# Patient Record
Sex: Female | Born: 1975 | Race: Black or African American | Hispanic: No | Marital: Single | Smoking: Never smoker
Health system: Southern US, Community
[De-identification: ages and names within clinical notes are randomized; demographics above are authoritative.]

## PROBLEM LIST (undated history)

## (undated) DIAGNOSIS — D649 Anemia, unspecified: Secondary | ICD-10-CM

## (undated) DIAGNOSIS — I10 Essential (primary) hypertension: Secondary | ICD-10-CM

## (undated) DIAGNOSIS — E042 Nontoxic multinodular goiter: Secondary | ICD-10-CM

## (undated) HISTORY — PX: NO PAST SURGERIES: SHX2092

---

## 2010-11-24 ENCOUNTER — Other Ambulatory Visit (HOSPITAL_COMMUNITY)
Admission: RE | Admit: 2010-11-24 | Discharge: 2010-11-24 | Disposition: A | Discharge status: Home / Self Care | Payer: BC Managed Care – PPO | Source: Ambulatory Visit | Attending: Family Medicine | Admitting: Family Medicine

## 2010-11-24 DIAGNOSIS — Z1159 Encounter for screening for other viral diseases: Secondary | ICD-10-CM | POA: Insufficient documentation

## 2010-11-24 DIAGNOSIS — Z124 Encounter for screening for malignant neoplasm of cervix: Secondary | ICD-10-CM | POA: Insufficient documentation

## 2011-08-21 ENCOUNTER — Ambulatory Visit (INDEPENDENT_AMBULATORY_CARE_PROVIDER_SITE_OTHER): Payer: BC Managed Care – PPO | Admitting: Family Medicine

## 2011-08-21 ENCOUNTER — Ambulatory Visit: Payer: BC Managed Care – PPO

## 2011-08-21 VITALS — BP 138/88 | HR 92 | Temp 98.4°F | Resp 17 | Ht 62.0 in | Wt 198.0 lb

## 2011-08-21 DIAGNOSIS — R1032 Left lower quadrant pain: Secondary | ICD-10-CM

## 2011-08-21 DIAGNOSIS — K5900 Constipation, unspecified: Secondary | ICD-10-CM

## 2011-08-21 LAB — POCT CBC
Granulocyte percent: 58.5 % (ref 37–80)
HCT, POC: 41.8 % (ref 37.7–47.9)
Hemoglobin: 12.9 g/dL (ref 12.2–16.2)
Lymph, poc: 2.5 (ref 0.6–3.4)
MCH, POC: 27 pg (ref 27–31.2)
MCHC: 30.9 g/dL — AB (ref 31.8–35.4)
MCV: 87.4 fL (ref 80–97)
MID (cbc): 0.8 (ref 0–0.9)
MPV: 8.8 fL (ref 0–99.8)
POC Granulocyte: 4.6 (ref 2–6.9)
POC LYMPH PERCENT: 31.1 % (ref 10–50)
POC MID %: 10.4 % (ref 0–12)
Platelet Count, POC: 555 K/uL — AB (ref 142–424)
RBC: 4.78 M/uL (ref 4.04–5.48)
RDW, POC: 15.3 %
WBC: 7.9 K/uL (ref 4.6–10.2)

## 2011-08-21 LAB — POCT URINALYSIS DIPSTICK
Bilirubin, UA: NEGATIVE
Blood, UA: NEGATIVE
Glucose, UA: NEGATIVE
Ketones, UA: NEGATIVE
Leukocytes, UA: NEGATIVE
Nitrite, UA: NEGATIVE
Spec Grav, UA: >=1.03
Urobilinogen, UA: 0.2
pH, UA: 5.5

## 2011-08-21 LAB — POCT UA - MICROSCOPIC ONLY
Casts, Ur, LPF, POC: NEGATIVE
Crystals, Ur, HPF, POC: NEGATIVE
Yeast, UA: NEGATIVE

## 2011-08-21 NOTE — Progress Notes (Signed)
[facility]:  Office Visit  Chief Complaint:  Chief Complaint  Patient presents with  . Abdominal Pain    llq    HPI: Cheryl Foley is a 36 y.o. female who complains of  1 week h/o of aching, gasey, intermittent localized LLQ abd pain. Tried MIralax and milk of magnesia with minimal relief. Associated with green colored , loose stools. Denies nausea/vomiting. Able to pass gas. After BM felt better. + nausea. LMP 07/31/2011. Not in relationship. Denies prior abd surgeries. PO intake nl, no new meds, foods, travels.  Food helps her abd pain. Denies h/o PUD,GERD,  gallstones, ovarian cysts. Denies f hx of IBD, crohns, IBS. Last BM yesterday. Denis dysuria, flank pain, vaginal dc  History reviewed. No pertinent past medical history. History reviewed. No pertinent past surgical history. History   Social History  . Marital Status: Unknown    Spouse Name: N/A    Number of Children: N/A  . Years of Education: N/A   Social History Main Topics  . Smoking status: Never Smoker   . Smokeless tobacco: None  . Alcohol Use: None  . Drug Use: None  . Sexually Active: None   Other Topics Concern  . None   Social History Narrative  . None   Family History  Problem Relation Age of Onset  . Hypertension Mother    No Known Allergies Prior to Admission medications   Not on File     ROS: The patient denies fevers, chills, night sweats, unintentional weight loss, chest pain, palpitations, wheezing, dyspnea on exertion, dysuria, hematuria, melena, numbness, weakness, or tingling.   All other systems have been reviewed and were otherwise negative with the exception of those mentioned in the HPI and as above.    PHYSICAL EXAM: Filed Vitals:   08/21/11 1848  BP: 138/88  Pulse: 92  Temp: 98.4 F (36.9 C)  Resp: 17   Filed Vitals:   08/21/11 1848  Height: 5\' 2"  (1.575 m)  Weight: 198 lb (89.812 kg)   Body mass index is 36.21 kg/(m^2).  General: Alert, no  acute distress, obese AA Female HEENT:  Normocephalic, atraumatic, oropharynx patent.  Cardiovascular:  Regular rate and rhythm, no rubs murmurs or gallops.  No Carotid bruits, radial pulse intact. No pedal edema.  Respiratory: Clear to auscultation bilaterally.  No wheezes, rales, or rhonchi.  No cyanosis, no use of accessory musculature GI: No organomegaly, abdomen is soft and non-tender, positive bowel sounds.  No masses. Skin: No rashes. Neurologic: Facial musculature symmetric. Psychiatric: Patient is appropriate throughout our interaction. Lymphatic: No cervical lymphadenopathy Musculoskeletal: Gait intact.   LABS: Results for orders placed in visit on 08/21/11  POCT CBC      Component Value Range   WBC 7.9  4.6 - 10.2 K/uL   Lymph, poc 2.5  0.6 - 3.4   POC LYMPH PERCENT 31.1  10 - 50 %L   MID (cbc) 0.8  0 - 0.9   POC MID % 10.4  0 - 12 %M   POC Granulocyte 4.6  2 - 6.9   Granulocyte percent 58.5  37 - 80 %G   RBC 4.78  4.04 - 5.48 M/uL   Hemoglobin 12.9  12.2 - 16.2 g/dL   HCT, POC 62.9  52.8 - 47.9 %   MCV 87.4  80 - 97 fL   MCH, POC 27.0  27 - 31.2 pg   MCHC 30.9 (*) 31.8 - 35.4 g/dL   RDW, POC 15.3  Platelet Count, POC 555 (*) 142 - 424 K/uL   MPV 8.8  0 - 99.8 fL  POCT UA - MICROSCOPIC ONLY      Component Value Range   WBC, Ur, HPF, POC 1-4     RBC, urine, microscopic 3-6     Bacteria, U Microscopic 3+     Mucus, UA moderate     Epithelial cells, urine per micros 4-8     Crystals, Ur, HPF, POC neg     Casts, Ur, LPF, POC neg     Yeast, UA neg    POCT URINALYSIS DIPSTICK      Component Value Range   Color, UA dark yellow     Clarity, UA cloudy     Glucose, UA neg     Bilirubin, UA neg     Ketones, UA neg 15mg /dL     Spec Grav, UA >=4.540     Blood, UA neg     pH, UA 5.5     Protein, UA trace     Urobilinogen, UA 0.2     Nitrite, UA neg     Leukocytes, UA Negative       EKG/XRAY:   Primary read interpreted by Dr. Conley Rolls at [facility]. Nonspecific gas  pattern, no obstruction, no free gas/air + stool   ASSESSMENT/PLAN: Encounter Diagnoses  Name Primary?  . Abdominal pain, acute, left lower quadrant Yes  . Constipation    Continue with miralax scheduled Add Sennokot S, colace or dulcolax until have consistent soft stools Increase fluids, increase fiber Monitor for s/sx of SBO, worsening N/V/abd pain, fevers, chills. Patient still has her appendix. At this time I do not think this is appendicitis or referred pain.  If continues to have abd pain then return for f/u    Robyn Galati PHUONG, DO 08/22/2011 11:14 AM

## 2011-08-23 ENCOUNTER — Telehealth: Payer: Self-pay

## 2011-08-23 NOTE — Telephone Encounter (Signed)
Pt states that she is still experiencing abdominal pain, pt did as directed and took a laxative but pain on her left side has not yet subsided.  Best# [phone]

## 2011-08-23 NOTE — Telephone Encounter (Signed)
I think pt needs to f/u er Dr Irwin Brakeman not.

## 2011-08-24 NOTE — Telephone Encounter (Signed)
Pt reports that she took some Dulcolax the day after OV and it allowed her to have a BM and seemed to completely clear out her system. Her pain did improve after that and she has been taking Miralax since then but has not had another BM. She has started having some abd pain again, esp after drinking some milk last night, but pain not as bad as before. Still no fever, no N/V. Encouraged pt to cont w/Miralax and inc water, fruits/vegs and exercise and to avoid cheese/dairy.Advised pt If she conts to have pain and/or no BMs to RTC for recheck tomorrow or before if worsens, or ED if after hours. Pt agreed.   After speaking w/Sarah called pt back and LM that she can also take Miralax BID until she has normal (soft) BM and then return to QD.

## 2012-01-16 DIAGNOSIS — E042 Nontoxic multinodular goiter: Secondary | ICD-10-CM

## 2012-01-16 HISTORY — DX: Nontoxic multinodular goiter: E04.2

## 2012-02-15 ENCOUNTER — Other Ambulatory Visit: Payer: Self-pay | Admitting: Family Medicine

## 2012-02-15 DIAGNOSIS — E042 Nontoxic multinodular goiter: Secondary | ICD-10-CM

## 2012-02-22 ENCOUNTER — Ambulatory Visit
Admission: RE | Admit: 2012-02-22 | Discharge: 2012-02-22 | Disposition: A | Discharge status: Home / Self Care | Payer: BC Managed Care – PPO | Source: Ambulatory Visit | Attending: Family Medicine | Admitting: Family Medicine

## 2012-02-22 DIAGNOSIS — E042 Nontoxic multinodular goiter: Secondary | ICD-10-CM

## 2012-02-26 ENCOUNTER — Other Ambulatory Visit: Payer: Self-pay | Admitting: Lactation Services

## 2012-02-26 DIAGNOSIS — E041 Nontoxic single thyroid nodule: Secondary | ICD-10-CM

## 2012-03-13 ENCOUNTER — Ambulatory Visit
Admission: RE | Admit: 2012-03-13 | Discharge: 2012-03-13 | Disposition: A | Discharge status: Home / Self Care | Payer: BC Managed Care – PPO | Source: Ambulatory Visit | Attending: Family Medicine | Admitting: Family Medicine

## 2012-03-13 ENCOUNTER — Other Ambulatory Visit (HOSPITAL_COMMUNITY)
Admission: RE | Admit: 2012-03-13 | Discharge: 2012-03-13 | Disposition: A | Discharge status: Home / Self Care | Payer: BC Managed Care – PPO | Source: Ambulatory Visit | Attending: Interventional Radiology | Admitting: Interventional Radiology

## 2012-03-13 DIAGNOSIS — E049 Nontoxic goiter, unspecified: Secondary | ICD-10-CM | POA: Insufficient documentation

## 2012-10-02 ENCOUNTER — Other Ambulatory Visit: Payer: Self-pay | Admitting: Family Medicine

## 2012-10-02 DIAGNOSIS — R102 Pelvic and perineal pain: Secondary | ICD-10-CM

## 2012-10-03 ENCOUNTER — Ambulatory Visit
Admission: RE | Admit: 2012-10-03 | Discharge: 2012-10-03 | Disposition: A | Discharge status: Home / Self Care | Payer: BC Managed Care – PPO | Source: Ambulatory Visit | Attending: Family Medicine | Admitting: Family Medicine

## 2012-10-03 DIAGNOSIS — R102 Pelvic and perineal pain: Secondary | ICD-10-CM

## 2012-10-29 ENCOUNTER — Other Ambulatory Visit: Payer: Self-pay | Admitting: Family Medicine

## 2012-10-29 DIAGNOSIS — N83209 Unspecified ovarian cyst, unspecified side: Secondary | ICD-10-CM

## 2012-11-21 ENCOUNTER — Ambulatory Visit
Admission: RE | Admit: 2012-11-21 | Discharge: 2012-11-21 | Disposition: A | Discharge status: Home / Self Care | Payer: BC Managed Care – PPO | Source: Ambulatory Visit | Attending: Family Medicine | Admitting: Family Medicine

## 2012-11-21 DIAGNOSIS — N83209 Unspecified ovarian cyst, unspecified side: Secondary | ICD-10-CM

## 2012-11-25 ENCOUNTER — Other Ambulatory Visit (HOSPITAL_COMMUNITY): Payer: Self-pay | Admitting: Family Medicine

## 2012-11-25 DIAGNOSIS — N83209 Unspecified ovarian cyst, unspecified side: Secondary | ICD-10-CM

## 2012-11-27 ENCOUNTER — Ambulatory Visit (HOSPITAL_COMMUNITY): Admission: RE | Admit: 2012-11-27 | Payer: BC Managed Care – PPO | Source: Ambulatory Visit

## 2013-01-12 ENCOUNTER — Other Ambulatory Visit: Payer: Self-pay | Admitting: Internal Medicine

## 2013-01-12 DIAGNOSIS — E042 Nontoxic multinodular goiter: Secondary | ICD-10-CM

## 2013-01-16 ENCOUNTER — Other Ambulatory Visit: Payer: BC Managed Care – PPO

## 2013-02-06 ENCOUNTER — Ambulatory Visit
Admission: RE | Admit: 2013-02-06 | Discharge: 2013-02-06 | Disposition: A | Discharge status: Home / Self Care | Payer: BC Managed Care – PPO | Source: Ambulatory Visit | Attending: Internal Medicine | Admitting: Internal Medicine

## 2013-02-06 DIAGNOSIS — E042 Nontoxic multinodular goiter: Secondary | ICD-10-CM

## 2013-03-06 ENCOUNTER — Encounter (HOSPITAL_COMMUNITY): Payer: Self-pay | Admitting: Pharmacist

## 2013-03-09 NOTE — H&P (Signed)
Cheryl Foley is an 38 y.o. female G0 who presents for surgical management of her left ovarian cyst.  She initially noted LLQ pain in September, an ultrasound was performed revealing a L ovarian cyst measuring 5cmx 4.6cm x 4cm suspected to be a hemorrhagic cyst. The patient was management with conservative treatment as her pain remained low to moderate and the cyst remained stable in size.  Moreover, CA 125 was low and the patient strongly desired not to have surgery.  Recently, follow up ultrasound showed an increase in cyst size and upon more questioning, the patient's pain has persisted.    Pertinent Gynecological History: Menses: menses regular q 31 days Contraception: abstinence, never sexually active DES exposure: denies Blood transfusions: none Sexually transmitted diseases: no past history Previous GYN Procedures: none  Last pap: normal Date: November 2012 OB History: G0, P0    Medical history:  GERD, Thyroid nodules, Seasonal allergies, Hyperlipidemia (borderline), Overweight Surgical history: none   Family History  Problem Relation Age of Onset  . Hypertension Mother     Social History:  reports that she has never smoked. She does not have any smokeless tobacco history on file. Her alcohol and drug histories are not on file.  Allergies: No Known Allergies  Medications: Multivitamin  Review of Systems  Constitutional: Negative for fever and chills.  Eyes: Negative for blurred vision and double vision.  Respiratory: Negative for cough and shortness of breath.   Cardiovascular: Negative for chest pain, palpitations and leg swelling.  Gastrointestinal: Negative for heartburn, nausea and vomiting.  Genitourinary: Negative for dysuria and urgency.  Musculoskeletal: Negative for myalgias.  Skin: Negative.   Neurological: Negative for dizziness, seizures and headaches.  Psychiatric/Behavioral: The patient is nervous/anxious.    Physical Exam  Constitutional: She is  oriented to person, place, and time. She appears well-developed and well-nourished.  HENT:  Head: Normocephalic.  Eyes: EOM are normal.  Neck: Normal range of motion. No thyromegaly present.  Cardiovascular: Normal rate and regular rhythm.   Respiratory: Breath sounds normal.  GI: She exhibits no distension. There is no rebound and no guarding.  Genitourinary:  labia - unremarkable, vagina - pink moist mucosa, no lesions, currently on menses, cervix - no discharge or lesions or CMT, adnexa - no masses appreciated, no adnexal tenderness bilaterally, uterus - nontender and normal size on palpation.    Musculoskeletal: She exhibits edema.  Neurological: She is oriented to person, place, and time.  Skin: Skin is warm and dry.    Labs:  September 2014: L ovarian cyst measuring 5cmx 4.6cm x 4cm suspected to be a hemorrhagic cyst. Repeat U/S 11/21/12: stable left ovarian cyst measuring the exact same size Seen by me on 11/27/12: CA 125:33 Limited study due to transabdominal only- Complex cyst with septation 6.6x5.3x4.8cm, increased in size from previous US, avascular. Uterus and right ovary not visualized.  Assessment/Plan: 38yo G0 who presents for laparoscopic ovarian cystectomy due to left ovarian cyst. -Risk, benefit, indications and alternative reviewed with patient include risk of bleeding, infection and injury including the need for potential oophorectomy.  Questions and concerns answered in the office. -NPO -LR @ 125cc/hr -SCDs to OR   Janyth Pupa, M 03/09/2013, 6:30 PM

## 2013-03-10 ENCOUNTER — Encounter (HOSPITAL_COMMUNITY): Payer: Self-pay | Admitting: *Deleted

## 2013-03-12 ENCOUNTER — Encounter (HOSPITAL_COMMUNITY)
Admission: RE | Disposition: A | Discharge status: Home / Self Care | Payer: Self-pay | Source: Ambulatory Visit | Attending: Obstetrics & Gynecology

## 2013-03-12 ENCOUNTER — Ambulatory Visit (HOSPITAL_COMMUNITY)
Admission: RE | Admit: 2013-03-12 | Discharge: 2013-03-12 | Disposition: A | Discharge status: Home / Self Care | Payer: BC Managed Care – PPO | Source: Ambulatory Visit | Attending: Obstetrics & Gynecology | Admitting: Obstetrics & Gynecology

## 2013-03-12 ENCOUNTER — Encounter (HOSPITAL_COMMUNITY): Payer: Self-pay | Admitting: Anesthesiology

## 2013-03-12 ENCOUNTER — Ambulatory Visit (HOSPITAL_COMMUNITY): Payer: BC Managed Care – PPO | Admitting: Anesthesiology

## 2013-03-12 ENCOUNTER — Encounter (HOSPITAL_COMMUNITY): Payer: BC Managed Care – PPO | Admitting: Anesthesiology

## 2013-03-12 DIAGNOSIS — K219 Gastro-esophageal reflux disease without esophagitis: Secondary | ICD-10-CM | POA: Insufficient documentation

## 2013-03-12 DIAGNOSIS — R1032 Left lower quadrant pain: Secondary | ICD-10-CM | POA: Insufficient documentation

## 2013-03-12 DIAGNOSIS — N83209 Unspecified ovarian cyst, unspecified side: Secondary | ICD-10-CM | POA: Insufficient documentation

## 2013-03-12 DIAGNOSIS — N801 Endometriosis of ovary: Secondary | ICD-10-CM | POA: Insufficient documentation

## 2013-03-12 DIAGNOSIS — N80109 Endometriosis of ovary, unspecified side, unspecified depth: Secondary | ICD-10-CM | POA: Insufficient documentation

## 2013-03-12 HISTORY — PX: LAPAROSCOPIC LYSIS OF ADHESIONS: SHX5905

## 2013-03-12 HISTORY — DX: Anemia, unspecified: D64.9

## 2013-03-12 HISTORY — DX: Nontoxic multinodular goiter: E04.2

## 2013-03-12 HISTORY — PX: LAPAROSCOPIC OVARIAN CYSTECTOMY: SHX6248

## 2013-03-12 LAB — PREGNANCY, URINE: PREG TEST UR: NEGATIVE

## 2013-03-12 LAB — CBC
HCT: 38.1 % (ref 36.0–46.0)
HEMOGLOBIN: 13 g/dL (ref 12.0–15.0)
MCH: 28.4 pg (ref 26.0–34.0)
MCHC: 34.1 g/dL (ref 30.0–36.0)
MCV: 83.2 fL (ref 78.0–100.0)
Platelets: 490 10*3/uL — ABNORMAL HIGH (ref 150–400)
RBC: 4.58 MIL/uL (ref 3.87–5.11)
RDW: 14.1 % (ref 11.5–15.5)
WBC: 10.6 10*3/uL — ABNORMAL HIGH (ref 4.0–10.5)

## 2013-03-12 SURGERY — EXCISION, CYST, OVARY, LAPAROSCOPIC
Anesthesia: General | Site: Abdomen

## 2013-03-12 MED ORDER — SODIUM CHLORIDE 0.9 % IJ SOLN
INTRAMUSCULAR | Status: DC | PRN
  Administered 2013-03-12: 3 mL

## 2013-03-12 MED ORDER — ONDANSETRON HCL 4 MG/2ML IJ SOLN: 4.0000 mg | Freq: Once | INTRAMUSCULAR | Status: DC | PRN

## 2013-03-12 MED ORDER — ONDANSETRON HCL 4 MG/2ML IJ SOLN
INTRAMUSCULAR | Status: AC
  Filled 2013-03-12: qty 2

## 2013-03-12 MED ORDER — MIDAZOLAM HCL 2 MG/2ML IJ SOLN
INTRAMUSCULAR | Status: AC
  Filled 2013-03-12: qty 2

## 2013-03-12 MED ORDER — FENTANYL CITRATE 0.05 MG/ML IJ SOLN
25.0000 ug | INTRAMUSCULAR | Status: DC | PRN
  Administered 2013-03-12: 25 ug via INTRAVENOUS
  Administered 2013-03-12: 50 ug via INTRAVENOUS
  Administered 2013-03-12 (×2): 25 ug via INTRAVENOUS

## 2013-03-12 MED ORDER — FENTANYL CITRATE 0.05 MG/ML IJ SOLN
INTRAMUSCULAR | Status: AC
  Filled 2013-03-12: qty 5

## 2013-03-12 MED ORDER — SODIUM CHLORIDE 0.9 % IJ SOLN
INTRAMUSCULAR | Status: AC
  Filled 2013-03-12: qty 10

## 2013-03-12 MED ORDER — ONDANSETRON HCL 4 MG/2ML IJ SOLN
INTRAMUSCULAR | Status: DC | PRN
  Administered 2013-03-12: 4 mg via INTRAVENOUS

## 2013-03-12 MED ORDER — FENTANYL CITRATE 0.05 MG/ML IJ SOLN
INTRAMUSCULAR | Status: DC | PRN
  Administered 2013-03-12 (×7): 50 ug via INTRAVENOUS

## 2013-03-12 MED ORDER — LIDOCAINE 1%/NA BICARB 0.1 MEQ INJECTION
INJECTION | INTRAVENOUS | Status: AC
  Filled 2013-03-12: qty 1

## 2013-03-12 MED ORDER — LIDOCAINE HCL (CARDIAC) 20 MG/ML IV SOLN
INTRAVENOUS | Status: DC | PRN
  Administered 2013-03-12: 70 mg via INTRAVENOUS
  Administered 2013-03-12: 30 mg via INTRAVENOUS

## 2013-03-12 MED ORDER — FENTANYL CITRATE 0.05 MG/ML IJ SOLN
INTRAMUSCULAR | Status: AC
  Filled 2013-03-12: qty 2

## 2013-03-12 MED ORDER — KETOROLAC TROMETHAMINE 30 MG/ML IJ SOLN
30.0000 mg | Freq: Once | INTRAMUSCULAR | Status: AC
  Administered 2013-03-12: 30 mg via INTRAVENOUS

## 2013-03-12 MED ORDER — ROCURONIUM BROMIDE 100 MG/10ML IV SOLN
INTRAVENOUS | Status: DC | PRN
  Administered 2013-03-12: 5 mg via INTRAVENOUS
  Administered 2013-03-12: 10 mg via INTRAVENOUS
  Administered 2013-03-12: 5 mg via INTRAVENOUS
  Administered 2013-03-12: 30 mg via INTRAVENOUS

## 2013-03-12 MED ORDER — PROPOFOL 10 MG/ML IV BOLUS
INTRAVENOUS | Status: DC | PRN
  Administered 2013-03-12: 200 mg via INTRAVENOUS

## 2013-03-12 MED ORDER — OXYCODONE HCL 5 MG/5ML PO SOLN: 5.0000 mg | Freq: Once | ORAL | Status: DC | PRN

## 2013-03-12 MED ORDER — GLYCOPYRROLATE 0.2 MG/ML IJ SOLN
INTRAMUSCULAR | Status: AC
  Filled 2013-03-12: qty 3

## 2013-03-12 MED ORDER — PROPOFOL 10 MG/ML IV EMUL
INTRAVENOUS | Status: AC
  Filled 2013-03-12: qty 20

## 2013-03-12 MED ORDER — ACETAMINOPHEN 325 MG PO TABS: 325.0000 mg | ORAL_TABLET | ORAL | Status: DC | PRN

## 2013-03-12 MED ORDER — KETOROLAC TROMETHAMINE 30 MG/ML IJ SOLN: 15.0000 mg | Freq: Once | INTRAMUSCULAR | Status: DC | PRN

## 2013-03-12 MED ORDER — NEOSTIGMINE METHYLSULFATE 1 MG/ML IJ SOLN
INTRAMUSCULAR | Status: AC
  Filled 2013-03-12: qty 1

## 2013-03-12 MED ORDER — NEOSTIGMINE METHYLSULFATE 1 MG/ML IJ SOLN
INTRAMUSCULAR | Status: DC | PRN
  Administered 2013-03-12: 3 mg via INTRAVENOUS

## 2013-03-12 MED ORDER — LACTATED RINGERS IV SOLN
INTRAVENOUS | Status: DC
  Administered 2013-03-12 (×2): via INTRAVENOUS

## 2013-03-12 MED ORDER — BUPIVACAINE HCL (PF) 0.25 % IJ SOLN
INTRAMUSCULAR | Status: AC
  Filled 2013-03-12: qty 30

## 2013-03-12 MED ORDER — KETOROLAC TROMETHAMINE 30 MG/ML IJ SOLN
INTRAMUSCULAR | Status: AC
  Filled 2013-03-12: qty 1

## 2013-03-12 MED ORDER — LIDOCAINE HCL (CARDIAC) 20 MG/ML IV SOLN
INTRAVENOUS | Status: AC
  Filled 2013-03-12: qty 5

## 2013-03-12 MED ORDER — ROCURONIUM BROMIDE 100 MG/10ML IV SOLN
INTRAVENOUS | Status: AC
  Filled 2013-03-12: qty 1

## 2013-03-12 MED ORDER — DEXAMETHASONE SODIUM PHOSPHATE 10 MG/ML IJ SOLN
INTRAMUSCULAR | Status: DC | PRN
  Administered 2013-03-12: 10 mg via INTRAVENOUS

## 2013-03-12 MED ORDER — DEXAMETHASONE SODIUM PHOSPHATE 10 MG/ML IJ SOLN
INTRAMUSCULAR | Status: AC
  Filled 2013-03-12: qty 1

## 2013-03-12 MED ORDER — OXYCODONE HCL 5 MG PO TABS: 5.0000 mg | ORAL_TABLET | Freq: Once | ORAL | Status: DC | PRN

## 2013-03-12 MED ORDER — GLYCOPYRROLATE 0.2 MG/ML IJ SOLN
INTRAMUSCULAR | Status: DC | PRN
  Administered 2013-03-12: 0.4 mg via INTRAVENOUS

## 2013-03-12 MED ORDER — MIDAZOLAM HCL 2 MG/2ML IJ SOLN
INTRAMUSCULAR | Status: DC | PRN
  Administered 2013-03-12: 2 mg via INTRAVENOUS

## 2013-03-12 MED ORDER — BUPIVACAINE HCL 0.25 % IJ SOLN
INTRAMUSCULAR | Status: DC | PRN
  Administered 2013-03-12: 20 mL

## 2013-03-12 MED ORDER — LACTATED RINGERS IV SOLN
INTRAVENOUS | Status: DC
  Administered 2013-03-12 (×2): via INTRAVENOUS

## 2013-03-12 MED ORDER — ACETAMINOPHEN 160 MG/5ML PO SOLN: 325.0000 mg | ORAL | Status: DC | PRN

## 2013-03-12 SURGICAL SUPPLY — 34 items
BARRIER ADHS 3X4 INTERCEED (GAUZE/BANDAGES/DRESSINGS) ×4 IMPLANT
CABLE HIGH FREQUENCY MONO STRZ (ELECTRODE) ×4 IMPLANT
CATH ROBINSON RED A/P 16FR (CATHETERS) ×4 IMPLANT
CHLORAPREP W/TINT 26ML (MISCELLANEOUS) ×4 IMPLANT
DECANTER SPIKE VIAL GLASS SM (MISCELLANEOUS) ×4 IMPLANT
DERMABOND ADVANCED (GAUZE/BANDAGES/DRESSINGS) ×2
DERMABOND ADVANCED .7 DNX12 (GAUZE/BANDAGES/DRESSINGS) ×2 IMPLANT
ELECT REM PT RETURN 9FT ADLT (ELECTROSURGICAL) ×4
ELECTRODE REM PT RTRN 9FT ADLT (ELECTROSURGICAL) ×2 IMPLANT
GLOVE BIO SURGEON STRL SZ 6.5 (GLOVE) ×6 IMPLANT
GLOVE BIO SURGEON STRL SZ7 (GLOVE) ×4 IMPLANT
GLOVE BIO SURGEONS STRL SZ 6.5 (GLOVE) ×2
GLOVE BIOGEL PI IND STRL 6.5 (GLOVE) ×4 IMPLANT
GLOVE BIOGEL PI IND STRL 7.0 (GLOVE) ×2 IMPLANT
GLOVE BIOGEL PI INDICATOR 6.5 (GLOVE) ×4
GLOVE BIOGEL PI INDICATOR 7.0 (GLOVE) ×2
GLOVE ECLIPSE 7.0 STRL STRAW (GLOVE) ×4 IMPLANT
GLOVE INDICATOR 7.0 STRL GRN (GLOVE) ×4 IMPLANT
GLOVE SURG SS PI 7.0 STRL IVOR (GLOVE) ×16 IMPLANT
GOWN STRL REUS W/TWL LRG LVL3 (GOWN DISPOSABLE) ×12 IMPLANT
NEEDLE INSUFFLATION 120MM (ENDOMECHANICALS) ×4 IMPLANT
PACK LAPAROSCOPY I 1258 (SET/KITS/TRAYS/PACK) ×4 IMPLANT
PAD OB MATERNITY 4.3X12.25 (PERSONAL CARE ITEMS) ×4 IMPLANT
PROTECTOR NERVE ULNAR (MISCELLANEOUS) ×8 IMPLANT
SCALPEL HARMONIC ACE (MISCELLANEOUS) ×4 IMPLANT
SET IRRIG TUBING LAPAROSCOPIC (IRRIGATION / IRRIGATOR) ×4 IMPLANT
SPONGE GAUZE 4X4 12PLY (GAUZE/BANDAGES/DRESSINGS) ×4 IMPLANT
SUT VIC AB 4-0 PS2 18 (SUTURE) ×4 IMPLANT
SUT VICRYL 0 UR6 27IN ABS (SUTURE) ×12 IMPLANT
SYR 30ML LL (SYRINGE) ×4 IMPLANT
TOWEL OR 17X26 10 PK STRL BLUE (TOWEL DISPOSABLE) ×8 IMPLANT
TROCAR Z-THREAD BLADED 11X100M (TROCAR) ×4 IMPLANT
TROCAR Z-THREAD BLADED 5X100MM (TROCAR) ×4 IMPLANT
WARMER LAPAROSCOPE (MISCELLANEOUS) ×4 IMPLANT

## 2013-03-12 NOTE — Anesthesia Postprocedure Evaluation (Signed)
  Anesthesia Post-op Note  Patient: Cheryl Foley  Procedure(s) Performed: Procedure(s): LAPAROSCOPIC drainage of OVARIAN CYSTECTOMY (Left) LAPAROSCOPIC LYSIS OF ADHESIONS (N/A) Patient is awake and responsive. Pain and nausea are reasonably well controlled. Vital signs are stable and clinically acceptable. Oxygen saturation is clinically acceptable. There are no apparent anesthetic complications at this time. Patient is ready for discharge.

## 2013-03-12 NOTE — Op Note (Signed)
PREOPERATIVE DIAGNOSIS:  Left ovarian cyst, pelvic pain POSTOPERATIVE DIAGNOSIS:  1) Left ovarian endometrioma 2) Endometriosis 3) Dense pelvic adhesions PROCEDURE PERFORMED: Operative laparoscopy with drainage of left ovarian cyst, Lysis of adhesions  SURGEON: Dr. Janyth Pupa ASSISTANT:Dr. Cyndia Skeeters ANESTHESIA: General endotracheal.  ESTIMATED BLOOD LOSS: Minimal.  URINE OUTPUT: 20cc of clear yellow urine prior to the start of the procedure.  SPECIMEN(S): 1) left ovarian cyst wall 2) peritoneal fluid COMPLICATIONS: None.  CONDITION: Stable.  FINDINGS: No ascites or peritoneal studding was appreciated.  Filmy adhesions noted between the side wall and bowel in the RUQ.  Normal appearing uterus and bilateral fallopian tubes.   Right ovary with dense adhesions to the lateral side wall.  Left ovary enlarged with a 5x6x5cm ovarian cyst with adhesions noted to the sigmoid colo and uterus.  Informed consent was obtained from the patient prior to taking her to the operating room where anesthesia was found to be adequate. She was placed in dorsal lithotomy position and examined under anesthesia. She was prepped and draped in normal sterile fashion. The bladder was catheterized with a foley under sterile technique.  A bi-valve speculum was then placed and the anterior lip of the cervix was grasped with the single tooth tenaculum. The [facility] uterine manipulator was then advanced into the uterus to provide uterine mobility. The speculum and tenaculum were then removed.  Attention was then turned to the patients abdomen where a 10 mm infraumbilical skin incision was made with the scalpel. The veress needle was carefully introduced into the peritoneal cavity while tenting the abdominal wall. Intraperitoneal placement was confirmed by use of a saline-drop test.  The gas was connected and confirmed intrabdominal placement by a low initial pressure of 4 mmHg. The abdomen was then insuflated with CO2 gas.   Intraabdominal placement was confirmed by the laparoscope and surveillance of the abdomen was performed.  In the right lower quadrant- Marcaine was injected, a 92mm skin incision was made with the scalpel.  The trocar and sleeve were then advanced without difficulty into the abdomen under direct visualization. In the left lower quadrant, in a similar fashion, local was injected and the trocar with sleeve were advanced under direct visualization without difficulty.  Peritoneal washings was obtained.  Examination of the abdomen was performed with the findings as listed above.  Once the right ovary was visualized and appeared grossly normal, attention was turned to the left ovary.  The adhesions to the bowel were taken down bluntly.  However, due to the dense nature of the adhesions, the harmonic scalpel was also used.  During manipulation, the cyst was incidentally ruptured with drainage of chocolate brown fluid consistent with an endometrioma.  With drainage, the demarcation of the bowel and ovary was better visualized and complete separation was obtained.  Once the cyst was completely drained, removal of the cyst wall was attempted; however, the wall was densely adherent to the ovarian tissue.  A small sample of the cyst wall was obtained.  No bleeding was seen from the ovary or the bowel.    Attention was then turned to the right upper quadrant where adhesions to the side wall were taken down sharply using monopolar scissors.  The bowel was completely freed from the side wall, hemostasis noted.  Re-examination of the abdominal cavity was performed- hemostasis was noted.  Interceed was placed over and within the left ovary.  The instruments were then removed from the patients abdomen with air allowed to fully escape.  The fascia  was closed using 0-vicryl.  The umbilical port site was closed using 4-0 vicryl and dermabond.  The bilateral lower quadrant port sites were closed with dermabond. The manipulator was  then removed from the cervix with no lacerations or bleeding identified. The patient tolerated the procedure well with all sponge, lap, and  needle counts correct. The patient was taken to recovery in stable condition.   Janyth Pupa, DO [phone] (pager) [phone] (office)

## 2013-03-12 NOTE — Transfer of Care (Signed)
Immediate Anesthesia Transfer of Care Note  Patient: Cheryl Foley  Procedure(s) Performed: Procedure(s): LAPAROSCOPIC drainage of OVARIAN CYSTECTOMY (Left) LAPAROSCOPIC LYSIS OF ADHESIONS (N/A)  Patient Location: PACU  Anesthesia Type:General  Level of Consciousness: awake and oriented  Airway & Oxygen Therapy: Patient Spontanous Breathing and Patient connected to nasal cannula oxygen  Post-op Assessment: Report given to PACU RN, Post -op Vital signs reviewed and stable and Patient moving all extremities X 4  Post vital signs: Reviewed and stable  Complications: No apparent anesthesia complications

## 2013-03-12 NOTE — Anesthesia Preprocedure Evaluation (Signed)
Anesthesia Evaluation  Patient identified by MRN, date of birth, ID band Patient awake    Reviewed: Allergy & Precautions, H&P , NPO status , Patient's Chart, lab work & pertinent test results  History of Anesthesia Complications Negative for: history of anesthetic complications  Airway Mallampati: II TM Distance: >3 FB Neck ROM: Full    Dental  (+) Teeth Intact   Pulmonary neg pulmonary ROS,  breath sounds clear to auscultation        Cardiovascular negative cardio ROS  Rhythm:Regular Rate:Normal     Neuro/Psych negative neurological ROS  negative psych ROS   GI/Hepatic negative GI ROS, Neg liver ROS,   Endo/Other  negative endocrine ROS  Renal/GU negative Renal ROS  negative genitourinary   Musculoskeletal negative musculoskeletal ROS (+)   Abdominal   Peds  Hematology negative hematology ROS (+)   Anesthesia Other Findings   Reproductive/Obstetrics Left ovarian cyst causing pain                           Anesthesia Physical Anesthesia Plan  ASA: I  Anesthesia Plan: General   Post-op Pain Management:    Induction: Intravenous  Airway Management Planned: Oral ETT  Additional Equipment: None  Intra-op Plan:   Post-operative Plan: Extubation in OR  Informed Consent: I have reviewed the patients History and Physical, chart, labs and discussed the procedure including the risks, benefits and alternatives for the proposed anesthesia with the patient or authorized representative who has indicated his/her understanding and acceptance.   Dental advisory given  Plan Discussed with: CRNA and Surgeon  Anesthesia Plan Comments:         Anesthesia Quick Evaluation

## 2013-03-12 NOTE — Interval H&P Note (Signed)
History and Physical Interval Note:  03/12/2013 11:52 AM  Cheryl Foley  has presented today for surgery, with the diagnosis of Ovarian Cyst  The various methods of treatment have been discussed with the patient. After consideration of risks, benefits and other options for treatment, the patient has consented to  Procedure(s): LAPAROSCOPIC OVARIAN CYSTECTOMY (Left) as a surgical intervention .  The patient's history has been reviewed, patient examined, no change in status, stable for surgery.  I have reviewed the patient's chart and labs.  Questions were answered to the patient's satisfaction.     Janyth Pupa, M

## 2013-03-12 NOTE — Discharge Instructions (Addendum)
HOME INSTRUCTIONS  Please note any unusual or excessive bleeding, pain, swelling. Mild dizziness or drowsiness are normal for about 24 hours after surgery.   Shower when comfortable  Restrictions: No driving for 24 hours or while taking pain medications.  Activity:  No heavy lifting (> 10 lbs), nothing in vagina (no tampons, douching, or intercourse) x 2 weeks; no tub baths for 2 weeks Vaginal spotting is expected but if your bleeding is heavy, period like,  please call the office   Incision: the bandaids will fall off when they are ready to; you may clean your incision with mild soap and water but do not rub or scrub the incision site.  You may experience slight bloody drainage from your incision periodically.  This is normal.  If you experience a large amount of drainage or the incision opens, please call your physician who will likely direct you to the emergency department.  Diet:  You may eat whatever you want.  Do not eat large meals.  Eat small frequent meals throughout the day.  Continue to drink a good amount of water at least 6-8 glasses of water per day, hydration is very important for the healing process.  Pain Management: Take Motrin and/or Percocet as prescribed/needed for pain.  Always take prescription pain medication with food.  Percocet may cause constipation, increase fluids and fiber and you may want to take an over-the-counter stool softener like Colace as needed up to 2x a day.    Alcohol -- Avoid for 24 hours and while taking pain medications.  Nausea: Take sips of ginger ale or soda  Fever -- Call physician if temperature over 101 degrees  Follow up:  If you do not already have a follow up appointment scheduled, please call the office at [phone].  If you experience fever (a temperature greater than 100.4), pain unrelieved by pain medication, shortness of breath, swelling of a single leg, or any other symptoms which are concerning to you please the office  immediately.   Post Anesthesia Home Care Instructions  Activity: Get plenty of rest for the remainder of the day. A responsible adult should stay with you for 24 hours following the procedure.  For the next 24 hours, DO NOT: -Drive a car -Paediatric nurse -Drink alcoholic beverages -Take any medication unless instructed by your physician -Make any legal decisions or sign important papers.  Meals: Start with liquid foods such as gelatin or soup. Progress to regular foods as tolerated. Avoid greasy, spicy, heavy foods. If nausea and/or vomiting occur, drink only clear liquids until the nausea and/or vomiting subsides. Call your physician if vomiting continues.  Special Instructions/Symptoms: Your throat may feel dry or sore from the anesthesia or the breathing tube placed in your throat during surgery. If this causes discomfort, gargle with warm salt water. The discomfort should disappear within 24 hours.

## 2013-03-13 ENCOUNTER — Encounter (HOSPITAL_COMMUNITY): Payer: Self-pay | Admitting: Obstetrics & Gynecology

## 2014-04-02 ENCOUNTER — Other Ambulatory Visit (HOSPITAL_COMMUNITY)
Admission: RE | Admit: 2014-04-02 | Discharge: 2014-04-02 | Disposition: A | Discharge status: Home / Self Care | Payer: BC Managed Care – PPO | Source: Ambulatory Visit | Attending: Obstetrics & Gynecology | Admitting: Obstetrics & Gynecology

## 2014-04-02 ENCOUNTER — Other Ambulatory Visit: Payer: Self-pay | Admitting: Obstetrics & Gynecology

## 2014-04-02 DIAGNOSIS — Z01419 Encounter for gynecological examination (general) (routine) without abnormal findings: Secondary | ICD-10-CM | POA: Insufficient documentation

## 2014-04-02 DIAGNOSIS — Z1151 Encounter for screening for human papillomavirus (HPV): Secondary | ICD-10-CM | POA: Diagnosis present

## 2014-04-06 LAB — CYTOLOGY - PAP

## 2015-07-24 ENCOUNTER — Emergency Department (HOSPITAL_COMMUNITY): Payer: BC Managed Care – PPO

## 2015-07-24 ENCOUNTER — Encounter (HOSPITAL_COMMUNITY): Payer: Self-pay | Admitting: *Deleted

## 2015-07-24 ENCOUNTER — Emergency Department (HOSPITAL_COMMUNITY)
Admission: EM | Admit: 2015-07-24 | Discharge: 2015-07-24 | Disposition: A | Discharge status: Home / Self Care | Payer: BC Managed Care – PPO | Attending: Emergency Medicine | Admitting: Emergency Medicine

## 2015-07-24 DIAGNOSIS — R0789 Other chest pain: Secondary | ICD-10-CM

## 2015-07-24 LAB — CBC
HEMATOCRIT: 40.5 % (ref 36.0–46.0)
Hemoglobin: 13.2 g/dL (ref 12.0–15.0)
MCH: 29.9 pg (ref 26.0–34.0)
MCHC: 32.6 g/dL (ref 30.0–36.0)
MCV: 91.6 fL (ref 78.0–100.0)
Platelets: 384 10*3/uL (ref 150–400)
RBC: 4.42 MIL/uL (ref 3.87–5.11)
RDW: 13.5 % (ref 11.5–15.5)
WBC: 9.3 10*3/uL (ref 4.0–10.5)

## 2015-07-24 LAB — COMPREHENSIVE METABOLIC PANEL
ALBUMIN: 3.5 g/dL (ref 3.5–5.0)
ALK PHOS: 86 U/L (ref 38–126)
ALT: 18 U/L (ref 14–54)
AST: 23 U/L (ref 15–41)
Anion gap: 9 (ref 5–15)
BILIRUBIN TOTAL: 0.4 mg/dL (ref 0.3–1.2)
BUN: 9 mg/dL (ref 6–20)
CALCIUM: 9.4 mg/dL (ref 8.9–10.3)
CO2: 20 mmol/L — AB (ref 22–32)
Chloride: 108 mmol/L (ref 101–111)
Creatinine, Ser: 0.83 mg/dL (ref 0.44–1.00)
GFR calc Af Amer: >60 mL/min (ref >60)
GFR calc non Af Amer: >60 mL/min (ref >60)
GLUCOSE: 88 mg/dL (ref 65–99)
Potassium: 4 mmol/L (ref 3.5–5.1)
SODIUM: 137 mmol/L (ref 135–145)
TOTAL PROTEIN: 7.4 g/dL (ref 6.5–8.1)

## 2015-07-24 LAB — LIPASE, BLOOD: Lipase: 21 U/L (ref 11–51)

## 2015-07-24 LAB — I-STAT TROPONIN, ED: Troponin i, poc: 0.02 ng/mL (ref 0.00–0.08)

## 2015-07-24 MED ORDER — RANITIDINE HCL 150 MG PO TABS: 150.0000 mg | ORAL_TABLET | Freq: Two times a day (BID) | ORAL | Status: DC

## 2015-07-24 MED ORDER — NAPROXEN 500 MG PO TABS: ORAL_TABLET | ORAL | Status: DC

## 2015-07-24 NOTE — ED Notes (Signed)
Pt arrives from [place] via [facility]. [facility] called EMS rt what they suspect are EKG changes. Pt received 324 mg ASA and 1 nitro with relief, pt began having pain again and received another nitro that again gave relief. Pt states her pain is more epigastric and she feels like it may be acid reflux, bc she has a hx of GERD.

## 2015-07-24 NOTE — ED Provider Notes (Signed)
CSN: [id number]     Arrival date & time 07/24/15  1246 History   First MD Initiated Contact with Patient 07/24/15 1255     Chief Complaint  Patient presents with  . Chest Pain    (Consider location/radiation/quality/duration/timing/severity/associated sxs/prior Treatment)  The history is provided by the patient and medical records. No language interpreter was used.    Cheryl Foley presents to ED from urgent care for chest pain. Patient states she has felt "gassy pressure" along the left side of her chest intermittently over the past two week. Pain began to worsen with eating over the last week. She has hx of GERD and thought it was worsening because of this, so she took zantac at home which relieved symptoms. When she awoke this morning, the pain was much worse and she felt very tender along her entire left chest wall. She states she has had costochondritis in the past and this feels similar but much more severe, so she went to urgent care for further evaluation. She was given 1 nitro at [facility] which relieved symptoms - sxs returned and another nitro was given that again gave relief. Denies sob, fever, diaphoresis, n/v, lower abdominal pain, back pain.   Risk factors: + OCP; Possible HLD ? (told cholesterol was over 200 but never diagnosed); No HTN, DM, heart disease. not a smoker, no cardiac family history.   Past Medical History  Diagnosis Date  . Anemia   . Multiple thyroid nodules 2014    Checked by u/s  1/15 negative    Past Surgical History  Procedure Laterality Date  . No past surgeries    . Laparoscopic ovarian cystectomy Left 03/12/2013    Procedure: LAPAROSCOPIC drainage of OVARIAN CYSTECTOMY;  Surgeon: [name], DO;  Location: [facility];  Service: Gynecology;  Laterality: Left;  . Laparoscopic lysis of adhesions N/A 03/12/2013    Procedure: LAPAROSCOPIC LYSIS OF ADHESIONS;  Surgeon: [name], DO;  Location: [facility];  Service: Gynecology;  Laterality: N/A;   Family  History  Problem Relation Age of Onset  . Hypertension Mother    Social History  Substance Use Topics  . Smoking status: Never Smoker   . Smokeless tobacco: Never Used  . Alcohol Use: No   OB History    No data available     Review of Systems  Constitutional: Negative for fever and chills.  HENT: Negative for congestion.   Eyes: Negative for visual disturbance.  Respiratory: Negative for cough and shortness of breath.   Cardiovascular: Positive for chest pain. Negative for palpitations and leg swelling.  Gastrointestinal: Negative for nausea, vomiting and abdominal pain.  Genitourinary: Negative for dysuria.  Musculoskeletal: Negative for back pain and neck pain.  Skin: Negative for rash.  Neurological: Negative for headaches.      Allergies  Review of patient's allergies indicates no known allergies.  Home Medications   Prior to Admission medications   Medication Sig Start Date End Date Taking? Authorizing Provider  norethindrone-ethinyl estradiol (MICROGESTIN,JUNEL,LOESTRIN) 1-20 MG-MCG tablet Take 1 tablet by mouth every evening.   Yes Historical Provider, MD  omeprazole (PRILOSEC OTC) 20 MG tablet Take 20 mg by mouth daily as needed (for heartburn).   Yes Historical Provider, MD  Pediatric Multiple Vit-C-FA (MULTIVITAMIN ANIMAL SHAPES, WITH CA/FA,) with C & FA chewable tablet Chew 1 tablet by mouth daily.   Yes Historical Provider, MD  Chlorpheniramine-DM (COUGH & COLD) 4-30 MG TABS Take by mouth.    Historical Provider, MD  naproxen (NAPROSYN) 500 MG tablet Take 1 tablet (500 mg total) by mouth 2 (two) times daily with a meal as needed for pain. 07/24/15   Ozella Almond Madine Sarr, PA-C  ranitidine (ZANTAC) 150 MG tablet Take 1 tablet (150 mg total) by mouth 2 (two) times daily. 07/24/15   Uchechukwu Dhawan Pilcher Leen Tworek, PA-C   BP 126/94 mmHg  Pulse 83  Temp(Src) 98.7 F (37.1 C) (Oral)  Resp 19  SpO2 97%  LMP 07/11/2015 (Exact Date) Physical Exam  Constitutional: She is oriented to  person, place, and time. She appears well-developed and well-nourished. No distress.  HENT:  Head: Normocephalic and atraumatic.  Cardiovascular: Normal rate, regular rhythm, normal heart sounds and intact distal pulses.  Exam reveals no gallop and no friction rub.   No murmur heard. Pulmonary/Chest: Effort normal and breath sounds normal. No respiratory distress. She has no wheezes. She has no rales. She exhibits tenderness (No overlying erythema, ecchymosis, or swelling. ).    Abdominal: Soft. Bowel sounds are normal. She exhibits no distension. There is no tenderness.  Musculoskeletal: She exhibits no edema.  Neurological: She is alert and oriented to person, place, and time.  Skin: Skin is warm and dry.  Nursing note and vitals reviewed.   ED Course  Procedures (including critical care time) Labs Review Labs Reviewed  COMPREHENSIVE METABOLIC PANEL - Abnormal; Notable for the following:    CO2 20 (*)    All other components within normal limits  CBC  LIPASE, BLOOD  I-STAT TROPOININ, ED    Imaging Review Dg Chest 2 View  07/24/2015  CLINICAL DATA:  Pt c/o chest pain middle and left chest that she questions if it is indigestion. Sort of an acidic feeling per pt. She states that she feels as if her food has been getting stuck in her chest area all week long. EXAM: CHEST  2 VIEW COMPARISON:  None. FINDINGS: Normal mediastinum and cardiac silhouette. Normal pulmonary vasculature. No evidence of effusion, infiltrate, or pneumothorax. No acute bony abnormality. IMPRESSION: No acute cardiopulmonary process. Electronically Signed   By: Suzy Bouchard M.D.   On: 07/24/2015 14:04   I have personally reviewed and evaluated these images and lab results as part of my medical decision-making.   EKG Interpretation   Date/Time:  Sunday July 24 2015 12:54:48 EDT Ventricular Rate:  82 PR Interval:    QRS Duration: 91 QT Interval:  384 QTC Calculation: 449 R Axis:   4 Text Interpretation:   Normal sinus rhythm Normal ECG Confirmed by RAY MD,  Andee Poles [phone]) on 07/24/2015 2:11:56 PM      MDM   Final diagnoses:  Chest wall pain   Cheryl Foley is a 40 y.o. female who presents to ED sent from urgent care for chest pain intermittently over the last 2 weeks, worse this morning - not exertional. Nitro and 324 ASA given. Patient had relief with nitro but states pain has now returned. Offered medication for pain which she declined at this time.  EKG from urgent care and EKG upon arrival to ED reviewed with attending, Dr. Jeanell Sparrow and both are reassuring. Patient is afebrile and hemodynamically stable. She is not tachycardic and is 96-98% on RA. She denies any difficulty breathing. On exam, she is very tender to palpation of her central/left chest wall. No abdominal tenderness to palpation. Heart score of 1.   Labs: Troponin negative, CBC and lipase within defined limits, CMP reassuring. Imaging: Chest x-ray with no acute cardiopulmonary disease.  A&P: Chest  wall pain Patient is to be discharged with recommendation to follow up with PCP in regards to today's hospital visit. Patient's symptoms unlikely to be of CAD etiology. Labs and imaging reviewed again prior to dc. Requesting refill of zantac for gerd which I have provided.  Pt has been advised return to the ED if develop any exertional chest pain- return precautions discussed and all questions answered. Patient appears reliable for follow up and is agreeable to plan as dictated above.   Patient discussed with Dr. Jeanell Sparrow who agrees with treatment plan.    [facility] Ether Wolters, PA-C 07/24/15 1608  Pattricia Boss, MD 07/24/15 [phone]

## 2015-07-24 NOTE — ED Notes (Signed)
Pt is in stable condition upon d/c and ambulates from ED. 

## 2015-07-24 NOTE — Discharge Instructions (Signed)
Please follow up with your primary care provider for discussion of your diagnoses and further evaluation after today's visit Take naproxen as needed for pain - please make sure to take this with food or it can upset your stomach. Rest and drink plenty of fluids.  Return to ER for new or worsening symptoms, any additional concerns.

## 2016-10-19 IMAGING — DX DG CHEST 2V
2 series · 2 of 2 positions shown · non-contrast
Comparison: None.

CLINICAL DATA: Pt c/o chest pain middle and left chest that she
questions if it is indigestion. Sort of an acidic feeling per pt.
She states that she feels as if her food has been getting stuck in
her chest area all week long.

EXAM:
CHEST  2 VIEW

[chest pa]
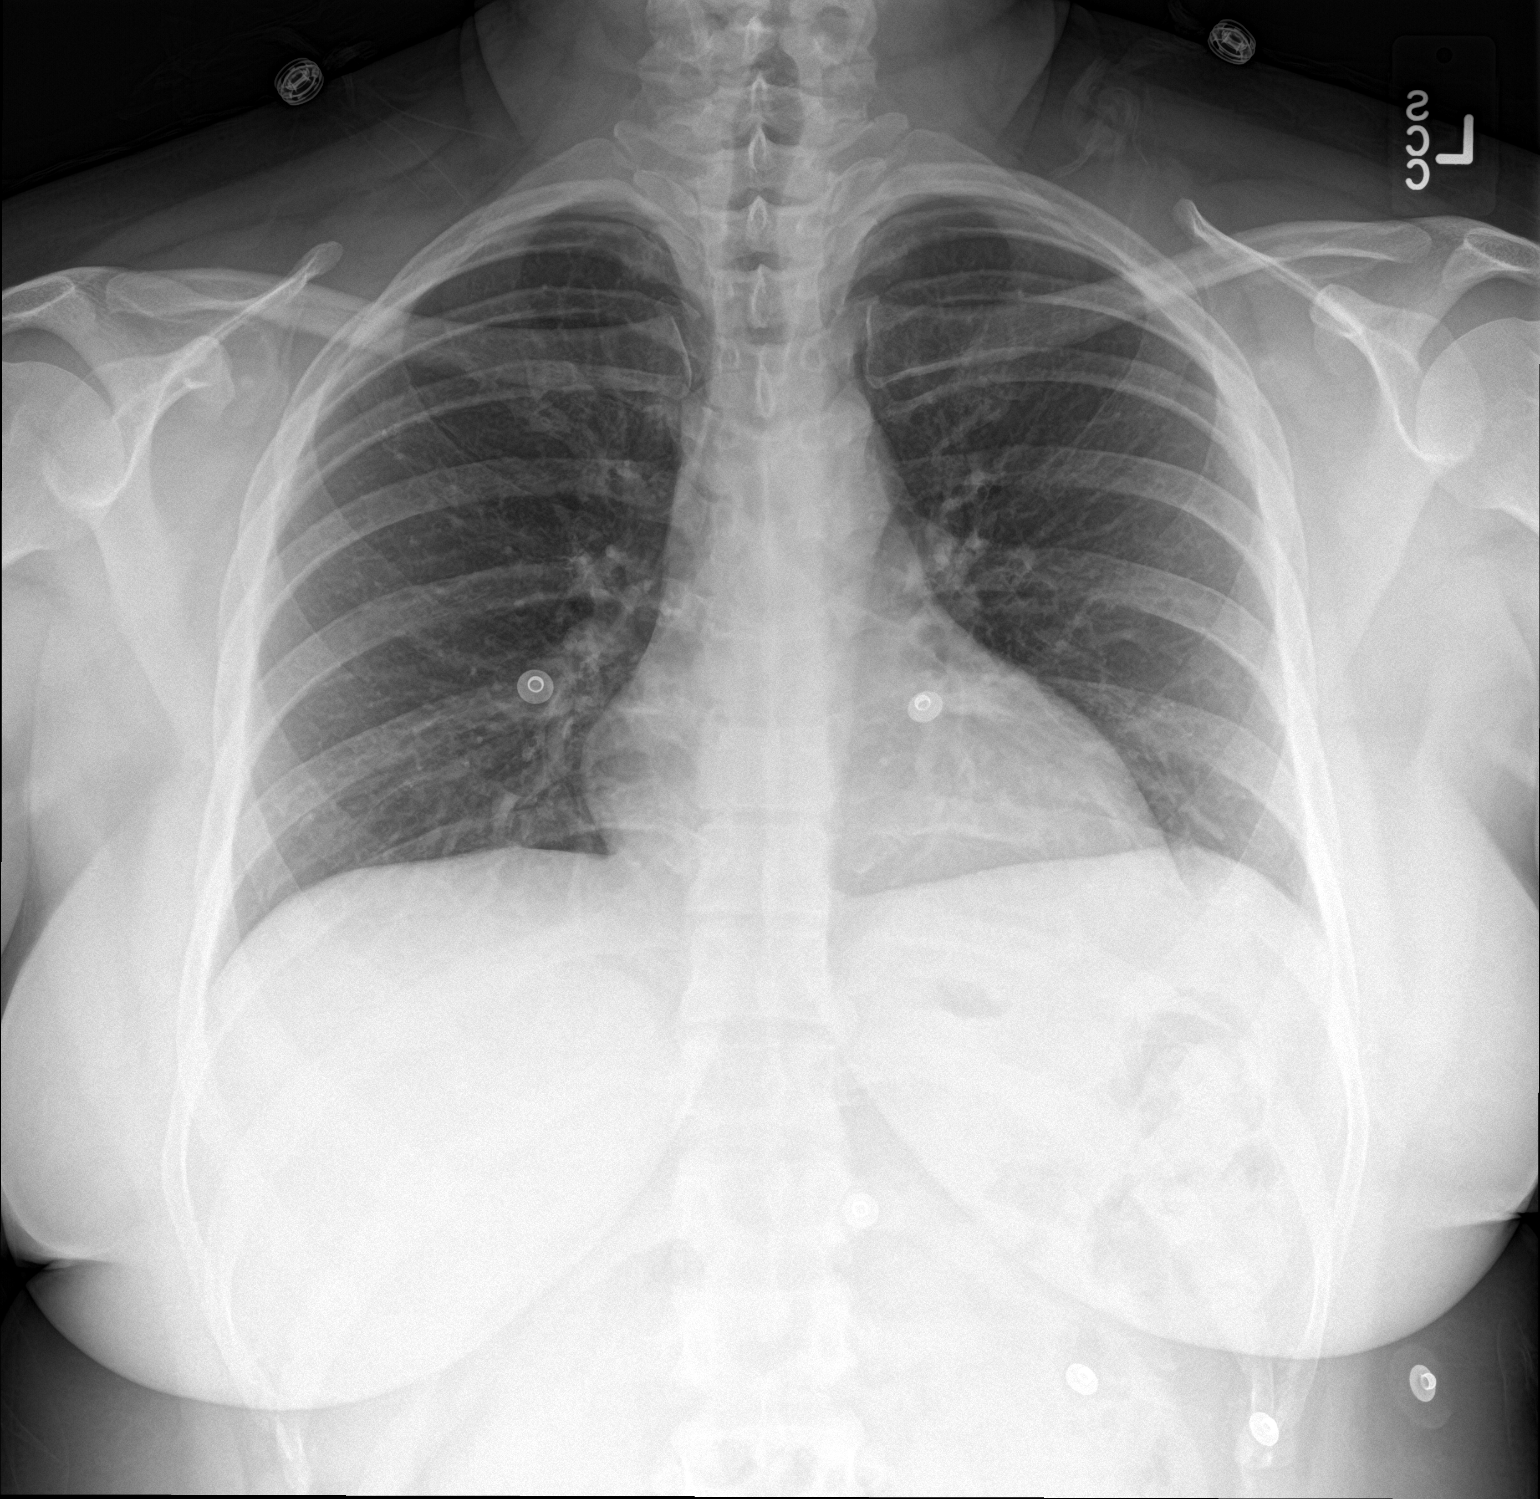

[chest lat]
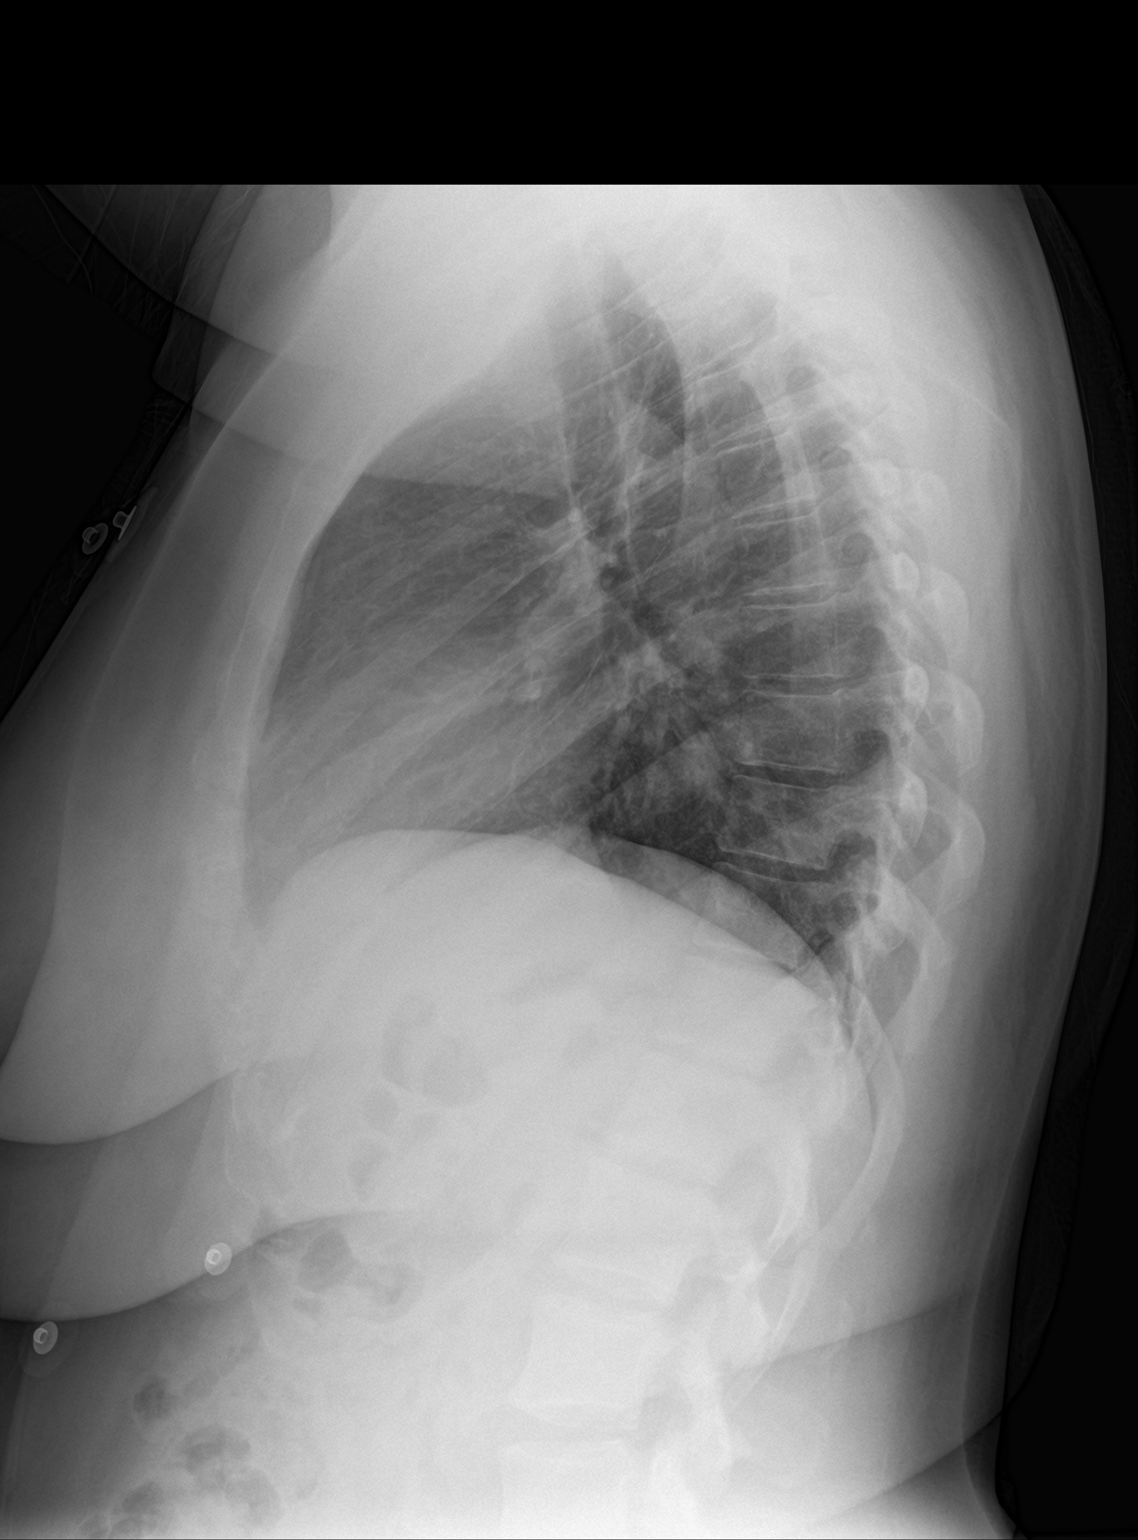

[2 of 2 positions shown; findings below may reference images not displayed]

FINDINGS: Normal mediastinum and cardiac silhouette. Normal pulmonary
vasculature. No evidence of effusion, infiltrate, or pneumothorax.
No acute bony abnormality.
IMPRESSION: No acute cardiopulmonary process.

## 2019-01-20 ENCOUNTER — Other Ambulatory Visit: Payer: Self-pay | Admitting: Obstetrics & Gynecology

## 2019-02-04 ENCOUNTER — Other Ambulatory Visit: Payer: Self-pay

## 2019-02-04 ENCOUNTER — Encounter (HOSPITAL_BASED_OUTPATIENT_CLINIC_OR_DEPARTMENT_OTHER): Payer: Self-pay | Admitting: Obstetrics & Gynecology

## 2019-02-06 ENCOUNTER — Other Ambulatory Visit: Payer: Self-pay

## 2019-02-06 ENCOUNTER — Encounter (HOSPITAL_BASED_OUTPATIENT_CLINIC_OR_DEPARTMENT_OTHER)
Admission: RE | Admit: 2019-02-06 | Discharge: 2019-02-06 | Disposition: A | Discharge status: Home / Self Care | Payer: BC Managed Care – PPO | Source: Ambulatory Visit | Attending: Obstetrics & Gynecology | Admitting: Obstetrics & Gynecology

## 2019-02-06 DIAGNOSIS — Z01812 Encounter for preprocedural laboratory examination: Secondary | ICD-10-CM | POA: Insufficient documentation

## 2019-02-06 LAB — COMPREHENSIVE METABOLIC PANEL
ALT: 22 U/L (ref 0–44)
AST: 21 U/L (ref 15–41)
Albumin: 3.5 g/dL (ref 3.5–5.0)
Alkaline Phosphatase: 60 U/L (ref 38–126)
Anion gap: 10 (ref 5–15)
BUN: 11 mg/dL (ref 6–20)
CO2: 23 mmol/L (ref 22–32)
Calcium: 9.3 mg/dL (ref 8.9–10.3)
Chloride: 104 mmol/L (ref 98–111)
Creatinine, Ser: 0.84 mg/dL (ref 0.44–1.00)
GFR calc Af Amer: >60 mL/min (ref >60)
GFR calc non Af Amer: >60 mL/min (ref >60)
Glucose, Bld: 88 mg/dL (ref 70–99)
Potassium: 4.2 mmol/L (ref 3.5–5.1)
Sodium: 137 mmol/L (ref 135–145)
Total Bilirubin: 0.6 mg/dL (ref 0.3–1.2)
Total Protein: 7.4 g/dL (ref 6.5–8.1)

## 2019-02-06 LAB — CBC
HCT: 33.5 % — ABNORMAL LOW (ref 36.0–46.0)
Hemoglobin: 10.4 g/dL — ABNORMAL LOW (ref 12.0–15.0)
MCH: 25.7 pg — ABNORMAL LOW (ref 26.0–34.0)
MCHC: 31 g/dL (ref 30.0–36.0)
MCV: 82.7 fL (ref 80.0–100.0)
Platelets: 544 10*3/uL — ABNORMAL HIGH (ref 150–400)
RBC: 4.05 MIL/uL (ref 3.87–5.11)
RDW: 16.3 % — ABNORMAL HIGH (ref 11.5–15.5)
WBC: 7.1 10*3/uL (ref 4.0–10.5)
nRBC: 0 % (ref 0.0–0.2)

## 2019-02-06 LAB — POCT PREGNANCY, URINE: Preg Test, Ur: NEGATIVE

## 2019-02-06 NOTE — Progress Notes (Signed)

## 2019-02-07 ENCOUNTER — Other Ambulatory Visit (HOSPITAL_COMMUNITY)
Admission: RE | Admit: 2019-02-07 | Discharge: 2019-02-07 | Disposition: A | Discharge status: Home / Self Care | Payer: BC Managed Care – PPO | Source: Ambulatory Visit | Attending: Obstetrics & Gynecology | Admitting: Obstetrics & Gynecology

## 2019-02-07 DIAGNOSIS — Z20822 Contact with and (suspected) exposure to covid-19: Secondary | ICD-10-CM | POA: Insufficient documentation

## 2019-02-07 DIAGNOSIS — Z01812 Encounter for preprocedural laboratory examination: Secondary | ICD-10-CM | POA: Diagnosis not present

## 2019-02-07 LAB — SARS CORONAVIRUS 2 (TAT 6-24 HRS): SARS Coronavirus 2: NEGATIVE

## 2019-02-08 NOTE — H&P (Signed)
44yo G0 who presents for hysteroscopy, D&C, myosure resection due to irregular bleeding.  1) AUB: In review, November she started to have irregular bleeding- mid Nov. she had a moderate to heavy period that lasted for about 1-2 weeks. Since then she continues to have some sort of daily bleeding. Sometimes it is heavy, other times it is only when she wipes. Previously her menses were regular with POPs.  Workup was completed and showed:  (01/19/2018): [place]- 10cm anteverted uteurs with fibroids- largest 3.7cm. Thickened endometrium with hypoechoic mass- 1.8cm ?fibroid- no blood flow noted. Normal right ovary, left ovary not seen- no adenxa masses seen  In review, she has a h/o endometriosis laparoscopic left ovarian cystectomy  - Family planning- Still desires a pregnancy in the future. She is not currently sexually active. She is aware that she may need ART/REI referral if and when she is ready for a pregnancy Last pap/hpv 04/2017 neg  Current Medications  Taking   Multivitamin   Propranolol HCl 60 MG Tablet 1 tablet Orally Once a day   iron 1 tab Oral , Notes: daily   Norethindrone Acetate 5 MG Tablet 1 tablet Orally Once a day    Past Medical History  GERD.   Thyroid nodules.   Seasonal allergies.   Hyperlipidemia (borderline).   Overweight.   Intermittent back pain.   Hemorrhagic left ovarian cyst 09/2012.   Hypertension.    Surgical History  Ovarian cyst removal-Meeyah Ovitt 03/13/2103   Family History  Father: alive 74 yrs  Mother: alive 42 yrs, diagnosed with Hypertension  Maternal Grand Mother: deceased  Brother 42: alive  Sister 42: alive  Sister 2: alive  No breast, colon or ovarian cancer. Ethnicity is African American, denies any GYN family cancer hx.   Social History  General:  Tobacco use  cigarettes: Never smoked Tobacco history last updated 01/20/2019 no Alcohol.  Caffeine: yes, 1 serving daily, soda, tea.  no Recreational drug use.  no Exercise.  Marital Status:  Single.  Children: none.  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    Gyn History  Sexual activity never sexually active, Oral sex only.  Periods : irregular.  LMP 12-2018.  Last pap smear date 04/2017 neg.  Denies H/O Last mammogram date.  Abnormal pap smear none.  Menarche 44, 56.    OB History  Never been pregnant per patient.    Allergies  Tramadol HCl: dizzy - Side Effects   Hospitalization/Major Diagnostic Procedure  No Hospitalization History.   Review of Systems  CONSTITUTIONAL:  no Appetite changes. no Chills. no Fatigue. no Fever.  CARDIOLOGY:  no Chest pain.  RESPIRATORY:  no Shortness of breath. no Cough.  UROLOGY:  no Dysuria. no Urinary frequency. no Urinary incontinence. no Urinary urgency.  GASTROENTEROLOGY:  no Abdominal pain. no Change in bowel habits. no Change in bowel movements.  FEMALE REPRODUCTIVE:  See HPI for details.  NEUROLOGY:  no Dizziness. no Headache.  PSYCHOLOGY:  no Anxiety. no Depression.  SKIN:  no Rash. no Hives.  HEMATOLOGY/LYMPH:  no Anemia. Using Blood Thinners no.     O: to be completed in office Wt 206.0, Wt change -2 lb, Ht 62, BMI 37.67, Temp 97.8, Pulse sitting 91, BP sitting 116/70.   Examination  General Examination: CONSTITUTIONAL: well developed, well nourished.  SKIN: warm and dry, no rashes.  NECK: supple, enlarged (unchanged from prior exam).  LUNGS: regular  breathing rate and effort.  ABDOMEN: soft and non-tender.  FEMALE GENITOURINARY: normal external genitalia, labia - unremarkable, vagina - pink moist mucosa, no lesions or abnormal discharge, cervix - no discharge or lesions or CMT, uterus - nontender and normal size on palpation.  EXTREMITIES: no edema present.  NEUROLOGIC EXAM: alert and oriented x 3.  PSYCH: appropriate mood and affect.     A/P: 44yo G0 who presents for  hysteroscopy, D&C, myosure resection due to irregular bleeding. -NPO -LR @ 125cc/hr -SCDs to OR -Risk/benefit and alternatives reviewed with patient including but not limited to risk of bleeding, infection and injury to surrounding organs.  Questions and concerns were addressed and she desires to proceed  Janyth Pupa, DO [phone] (cell) [phone] (office)

## 2019-02-11 ENCOUNTER — Other Ambulatory Visit: Payer: Self-pay

## 2019-02-11 ENCOUNTER — Ambulatory Visit (HOSPITAL_BASED_OUTPATIENT_CLINIC_OR_DEPARTMENT_OTHER): Payer: BC Managed Care – PPO | Admitting: Certified Registered Nurse Anesthetist

## 2019-02-11 ENCOUNTER — Encounter (HOSPITAL_BASED_OUTPATIENT_CLINIC_OR_DEPARTMENT_OTHER): Payer: Self-pay | Admitting: Obstetrics & Gynecology

## 2019-02-11 ENCOUNTER — Ambulatory Visit (HOSPITAL_BASED_OUTPATIENT_CLINIC_OR_DEPARTMENT_OTHER)
Admission: RE | Admit: 2019-02-11 | Discharge: 2019-02-11 | Disposition: A | Discharge status: Home / Self Care | Payer: BC Managed Care – PPO | Attending: Obstetrics & Gynecology | Admitting: Obstetrics & Gynecology

## 2019-02-11 ENCOUNTER — Encounter (HOSPITAL_BASED_OUTPATIENT_CLINIC_OR_DEPARTMENT_OTHER)
Admission: RE | Disposition: A | Discharge status: Home / Self Care | Payer: Self-pay | Source: Home / Self Care | Attending: Obstetrics & Gynecology

## 2019-02-11 DIAGNOSIS — E663 Overweight: Secondary | ICD-10-CM | POA: Insufficient documentation

## 2019-02-11 DIAGNOSIS — I10 Essential (primary) hypertension: Secondary | ICD-10-CM | POA: Diagnosis not present

## 2019-02-11 DIAGNOSIS — N939 Abnormal uterine and vaginal bleeding, unspecified: Secondary | ICD-10-CM | POA: Diagnosis not present

## 2019-02-11 DIAGNOSIS — Z6837 Body mass index (BMI) 37.0-37.9, adult: Secondary | ICD-10-CM | POA: Insufficient documentation

## 2019-02-11 DIAGNOSIS — D259 Leiomyoma of uterus, unspecified: Secondary | ICD-10-CM | POA: Diagnosis not present

## 2019-02-11 HISTORY — PX: DILATATION & CURETTAGE/HYSTEROSCOPY WITH MYOSURE: SHX6511

## 2019-02-11 HISTORY — DX: Essential (primary) hypertension: I10

## 2019-02-11 SURGERY — DILATATION & CURETTAGE/HYSTEROSCOPY WITH MYOSURE
Anesthesia: General | Site: Uterus

## 2019-02-11 MED ORDER — ACETAMINOPHEN 500 MG PO TABS
ORAL_TABLET | ORAL | Status: AC
  Filled 2019-02-11: qty 2

## 2019-02-11 MED ORDER — ONDANSETRON HCL 4 MG/2ML IJ SOLN
INTRAMUSCULAR | Status: DC | PRN
  Administered 2019-02-11: 4 mg via INTRAVENOUS

## 2019-02-11 MED ORDER — GLYCOPYRROLATE 0.2 MG/ML IJ SOLN
INTRAMUSCULAR | Status: AC
  Filled 2019-02-11: qty 2

## 2019-02-11 MED ORDER — HYDROMORPHONE HCL 1 MG/ML IJ SOLN: 0.2500 mg | INTRAMUSCULAR | Status: DC | PRN

## 2019-02-11 MED ORDER — MIDAZOLAM HCL 2 MG/2ML IJ SOLN
INTRAMUSCULAR | Status: DC | PRN
  Administered 2019-02-11: 2 mg via INTRAVENOUS

## 2019-02-11 MED ORDER — ARTIFICIAL TEARS OPHTHALMIC OINT
TOPICAL_OINTMENT | OPHTHALMIC | Status: DC | PRN
  Administered 2019-02-11: 1 via OPHTHALMIC

## 2019-02-11 MED ORDER — ARTIFICIAL TEARS OPHTHALMIC OINT
TOPICAL_OINTMENT | OPHTHALMIC | Status: AC
  Filled 2019-02-11: qty 3.5

## 2019-02-11 MED ORDER — MIDAZOLAM HCL 2 MG/2ML IJ SOLN: 1.0000 mg | INTRAMUSCULAR | Status: DC | PRN

## 2019-02-11 MED ORDER — SODIUM CHLORIDE 0.9 % IR SOLN
Status: DC | PRN
  Administered 2019-02-11: 3000 mL

## 2019-02-11 MED ORDER — GLYCOPYRROLATE 0.2 MG/ML IJ SOLN
INTRAMUSCULAR | Status: DC | PRN
  Administered 2019-02-11: .2 mg via INTRAVENOUS

## 2019-02-11 MED ORDER — LIDOCAINE 2% (20 MG/ML) 5 ML SYRINGE
INTRAMUSCULAR | Status: AC
  Filled 2019-02-11: qty 5

## 2019-02-11 MED ORDER — LIDOCAINE HCL (CARDIAC) PF 100 MG/5ML IV SOSY
PREFILLED_SYRINGE | INTRAVENOUS | Status: DC | PRN
  Administered 2019-02-11: 60 mg via INTRATRACHEAL

## 2019-02-11 MED ORDER — DEXAMETHASONE SODIUM PHOSPHATE 10 MG/ML IJ SOLN
INTRAMUSCULAR | Status: DC | PRN
  Administered 2019-02-11: 5 mg via INTRAVENOUS

## 2019-02-11 MED ORDER — PROPOFOL 10 MG/ML IV BOLUS
INTRAVENOUS | Status: DC | PRN
  Administered 2019-02-11: 200 mg via INTRAVENOUS

## 2019-02-11 MED ORDER — KETOROLAC TROMETHAMINE 30 MG/ML IJ SOLN
INTRAMUSCULAR | Status: AC
  Filled 2019-02-11: qty 1

## 2019-02-11 MED ORDER — FENTANYL CITRATE (PF) 100 MCG/2ML IJ SOLN
INTRAMUSCULAR | Status: DC | PRN
  Administered 2019-02-11: 100 ug via INTRAVENOUS

## 2019-02-11 MED ORDER — LACTATED RINGERS IV SOLN: INTRAVENOUS | Status: DC

## 2019-02-11 MED ORDER — ACETAMINOPHEN 500 MG PO TABS
1000.0000 mg | ORAL_TABLET | Freq: Once | ORAL | Status: AC
  Administered 2019-02-11: 11:00:00 1000 mg via ORAL

## 2019-02-11 MED ORDER — MIDAZOLAM HCL 2 MG/2ML IJ SOLN
INTRAMUSCULAR | Status: AC
  Filled 2019-02-11: qty 2

## 2019-02-11 MED ORDER — DEXAMETHASONE SODIUM PHOSPHATE 10 MG/ML IJ SOLN
INTRAMUSCULAR | Status: AC
  Filled 2019-02-11: qty 1

## 2019-02-11 MED ORDER — PHENYLEPHRINE 40 MCG/ML (10ML) SYRINGE FOR IV PUSH (FOR BLOOD PRESSURE SUPPORT)
PREFILLED_SYRINGE | INTRAVENOUS | Status: AC
  Filled 2019-02-11: qty 10

## 2019-02-11 MED ORDER — PHENYLEPHRINE HCL (PRESSORS) 10 MG/ML IV SOLN
INTRAVENOUS | Status: DC | PRN
  Administered 2019-02-11 (×2): 120 ug via INTRAVENOUS

## 2019-02-11 MED ORDER — PROPOFOL 10 MG/ML IV BOLUS
INTRAVENOUS | Status: AC
  Filled 2019-02-11: qty 20

## 2019-02-11 MED ORDER — LIDOCAINE-EPINEPHRINE 1 %-1:100000 IJ SOLN
INTRAMUSCULAR | Status: DC | PRN
  Administered 2019-02-11: 20 mL

## 2019-02-11 MED ORDER — ONDANSETRON HCL 4 MG/2ML IJ SOLN
INTRAMUSCULAR | Status: AC
  Filled 2019-02-11: qty 2

## 2019-02-11 MED ORDER — FENTANYL CITRATE (PF) 100 MCG/2ML IJ SOLN: 50.0000 ug | INTRAMUSCULAR | Status: DC | PRN

## 2019-02-11 MED ORDER — FENTANYL CITRATE (PF) 100 MCG/2ML IJ SOLN
INTRAMUSCULAR | Status: AC
  Filled 2019-02-11: qty 2

## 2019-02-11 MED ORDER — SILVER NITRATE-POT NITRATE 75-25 % EX MISC
CUTANEOUS | Status: DC | PRN
  Administered 2019-02-11: 1

## 2019-02-11 SURGICAL SUPPLY — 19 items
CATH ROBINSON RED A/P 16FR (CATHETERS) IMPLANT
DEVICE MYOSURE LITE (MISCELLANEOUS) IMPLANT
DEVICE MYOSURE REACH (MISCELLANEOUS) ×3 IMPLANT
DILATOR CANAL MILEX (MISCELLANEOUS) IMPLANT
GAUZE 4X4 16PLY RFD (DISPOSABLE) ×3 IMPLANT
GLOVE BIOGEL PI IND STRL 6.5 (GLOVE) ×1 IMPLANT
GLOVE BIOGEL PI IND STRL 7.0 (GLOVE) ×1 IMPLANT
GLOVE BIOGEL PI INDICATOR 6.5 (GLOVE) ×2
GLOVE BIOGEL PI INDICATOR 7.0 (GLOVE) ×2
GLOVE ECLIPSE 6.5 STRL STRAW (GLOVE) ×3 IMPLANT
GOWN STRL REUS W/TWL LRG LVL3 (GOWN DISPOSABLE) ×6 IMPLANT
KIT PROCEDURE FLUENT (KITS) ×3 IMPLANT
Mirena IUD ×3 IMPLANT
PACK VAGINAL MINOR WOMEN LF (CUSTOM PROCEDURE TRAY) ×3 IMPLANT
PAD OB MATERNITY 4.3X12.25 (PERSONAL CARE ITEMS) ×3 IMPLANT
PAD PREP 24X48 CUFFED NSTRL (MISCELLANEOUS) ×3 IMPLANT
SEAL ROD LENS SCOPE MYOSURE (ABLATOR) ×3 IMPLANT
SLEEVE SCD COMPRESS KNEE MED (MISCELLANEOUS) ×3 IMPLANT
TOWEL GREEN STERILE FF (TOWEL DISPOSABLE) ×6 IMPLANT

## 2019-02-11 NOTE — Anesthesia Postprocedure Evaluation (Signed)
Anesthesia Post Note  Patient: Cheryl Foley  Procedure(s) Performed: DILATATION & CURETTAGE/HYSTEROSCOPY WITH MYOSURE, MYOMECTOMY (N/A Uterus)     Patient location during evaluation: PACU Anesthesia Type: General Level of consciousness: awake and alert Pain management: pain level controlled Vital Signs Assessment: post-procedure vital signs reviewed and stable Respiratory status: spontaneous breathing, nonlabored ventilation and respiratory function stable Cardiovascular status: blood pressure returned to baseline and stable Postop Assessment: no apparent nausea or vomiting Anesthetic complications: no    Last Vitals:  Vitals:   02/11/19 1330 02/11/19 1345  BP: (!) 138/98 (!) 146/88  Pulse: 84 69  Resp: 20 18  Temp:    SpO2: 97% 100%    Last Pain:  Vitals:   02/11/19 1315  TempSrc:   PainSc: 0-No pain                 Adin Laker,W. EDMOND

## 2019-02-11 NOTE — Interval H&P Note (Signed)
History and Physical Interval Note:  02/11/2019 12:02 PM  Cheryl Foley  has presented today for surgery, with the diagnosis of N93.9 abnormal uterine bleeding N94.89 endometrial mass.  The various methods of treatment have been discussed with the patient and family. After consideration of risks, benefits and other options for treatment, the patient has consented to  Procedure(s): [place] (N/A) and IUD insertion as a surgical intervention.  The patient's history has been reviewed, patient examined, no change in status, stable for surgery.  I have reviewed the patient's chart and labs.  Questions were answered to the patient's satisfaction.     [name]

## 2019-02-11 NOTE — Transfer of Care (Signed)
Immediate Anesthesia Transfer of Care Note  Patient: Cheryl Foley  Procedure(s) Performed: DILATATION & CURETTAGE/HYSTEROSCOPY WITH MYOSURE, MYOMECTOMY (N/A Uterus)  Patient Location: PACU  Anesthesia Type:General  Level of Consciousness: awake, alert  and pateint uncooperative  Airway & Oxygen Therapy: Patient Spontanous Breathing and Patient connected to face mask oxygen  Post-op Assessment: Report given to RN, Post -op Vital signs reviewed and stable and Patient moving all extremities X 4  Post vital signs: Reviewed and stable  Last Vitals:  Vitals Value Taken Time  BP 146/105 02/11/19 1306  Temp    Pulse 90 02/11/19 1309  Resp 20 02/11/19 1310  SpO2 100 % 02/11/19 1309  Vitals shown include unvalidated device data.  Last Pain:  Vitals:   02/11/19 1112  TempSrc: Tympanic  PainSc: 0-No pain         Complications: No apparent anesthesia complications

## 2019-02-11 NOTE — Anesthesia Preprocedure Evaluation (Addendum)
Anesthesia Evaluation  Patient identified by MRN, date of birth, ID band Patient awake    Reviewed: Allergy & Precautions, H&P , NPO status , Patient's Chart, lab work & pertinent test results, reviewed documented beta blocker date and time   Airway Mallampati: II  TM Distance: >3 FB Neck ROM: Full    Dental no notable dental hx. (+) Teeth Intact, Dental Advisory Given   Pulmonary neg pulmonary ROS,    Pulmonary exam normal breath sounds clear to auscultation       Cardiovascular hypertension, Pt. on medications and Pt. on home beta blockers  Rhythm:Regular Rate:Normal     Neuro/Psych negative neurological ROS  negative psych ROS   GI/Hepatic negative GI ROS, Neg liver ROS,   Endo/Other  Morbid obesity  Renal/GU negative Renal ROS  negative genitourinary   Musculoskeletal   Abdominal   Peds  Hematology  (+) Blood dyscrasia, anemia ,   Anesthesia Other Findings   Reproductive/Obstetrics negative OB ROS                            Anesthesia Physical Anesthesia Plan  ASA: III  Anesthesia Plan: General   Post-op Pain Management:    Induction: Intravenous  PONV Risk Score and Plan: 4 or greater and Ondansetron, Dexamethasone and Midazolam  Airway Management Planned: LMA  Additional Equipment:   Intra-op Plan:   Post-operative Plan: Extubation in OR  Informed Consent: I have reviewed the patients History and Physical, chart, labs and discussed the procedure including the risks, benefits and alternatives for the proposed anesthesia with the patient or authorized representative who has indicated his/her understanding and acceptance.     Dental advisory given  Plan Discussed with: CRNA  Anesthesia Plan Comments:         Anesthesia Quick Evaluation

## 2019-02-11 NOTE — Discharge Instructions (Addendum)
HOME INSTRUCTIONS  Please note any unusual or excessive bleeding, pain, swelling. Mild dizziness or drowsiness are normal for about 24 hours after surgery.   Shower when comfortable  Restrictions: No driving for 24 hours or while taking pain medications.  Activity:  No heavy lifting (> 20 lbs), nothing in vagina (no tampons, douching, or intercourse) x 2 weeks; no tub baths for 2 weeks Vaginal spotting is expected but if your bleeding is heavy, period like,  please call the office  Diet:  You may return to your regular diet.  Do not eat large meals.  Eat small frequent meals throughout the day.  Continue to drink a good amount of water at least 6-8 glasses of water per day, hydration is very important for the healing process.  Pain Management: Take Motrin and/or tylenol as needed for pain.  Always take prescription pain medication with food, it may cause constipation, increase fluids and fiber and you may want to take an over-the-counter stool softener like Colace as needed up to 2x a day.    Alcohol -- Avoid for 24 hours and while taking pain medications.  Nausea: Take sips of ginger ale or soda  Fever -- Call physician if temperature over 101 degrees  Follow up:  If you do not already have a follow up appointment scheduled, please call the office at [phone].  If you experience fever (a temperature greater than 100.4), pain unrelieved by pain medication, shortness of breath, swelling of a single leg, or any other symptoms which are concerning to you please the office immediately.  Anesthesia instructions: Next dose of Tylenol can be taken at 530pm    Post Anesthesia Home Care Instructions  Activity: Get plenty of rest for the remainder of the day. A responsible individual must stay with you for 24 hours following the procedure.  For the next 24 hours, DO NOT: -Drive a car -Paediatric nurse -Drink alcoholic beverages -Take any medication unless instructed by your  physician -Make any legal decisions or sign important papers.  Meals: Start with liquid foods such as gelatin or soup. Progress to regular foods as tolerated. Avoid greasy, spicy, heavy foods. If nausea and/or vomiting occur, drink only clear liquids until the nausea and/or vomiting subsides. Call your physician if vomiting continues.  Special Instructions/Symptoms: Your throat may feel dry or sore from the anesthesia or the breathing tube placed in your throat during surgery. If this causes discomfort, gargle with warm salt water. The discomfort should disappear within 24 hours.  If you had a scopolamine patch placed behind your ear for the management of post- operative nausea and/or vomiting:  1. The medication in the patch is effective for 72 hours, after which it should be removed.  Wrap patch in a tissue and discard in the trash. Wash hands thoroughly with soap and water. 2. You may remove the patch earlier than 72 hours if you experience unpleasant side effects which may include dry mouth, dizziness or visual disturbances. 3. Avoid touching the patch. Wash your hands with soap and water after contact with the patch.

## 2019-02-11 NOTE — Anesthesia Procedure Notes (Signed)
Procedure Name: LMA Insertion Date/Time: 02/11/2019 12:18 PM Performed by: Collier Bullock, CRNA Pre-anesthesia Checklist: Patient identified, Emergency Drugs available, Suction available and Patient being monitored Patient Re-evaluated:Patient Re-evaluated prior to induction Oxygen Delivery Method: Circle system utilized Preoxygenation: Pre-oxygenation with 100% oxygen Induction Type: IV induction Ventilation: Mask ventilation without difficulty LMA: LMA inserted LMA Size: 4.0 Tube size: 4.0 mm Number of attempts: 1 Placement Confirmation: positive ETCO2 and breath sounds checked- equal and bilateral Tube secured with: Tape

## 2019-02-11 NOTE — Op Note (Signed)
Operative Report  PreOp: 1) Abnormal uterine bleeding 2) uterine mass PostOp: 1) AUB 2) uterine fibroid Procedure:  Hysteroscopy, dilation and curettage, myosure myomectomy, Mirena insertion Surgeon: Dr. Janyth Pupa Anesthesia: General, cervical block Complications:none EBL: [id number] UOP: 10cc IVF:1500cc Discrepancy: 815cc  Findings:8cm anteverted uterus with ~ 2cm uterine fibroid noted within the endometrial cavity, both ostia visualized bilaterally Specimens: 1) endometrial curettings along with uterine fibroids   Procedure: The patient was taken to the operating room where she underwent general anesthesia without difficulty. The patient was placed in a low lithotomy position using Allen stirrups. The patient was examined with the findings as noted above.  She was then prepped and draped in the normal sterile fashion. The bladder was drained using a red rubber urethral catheter. A sterile speculum was inserted into the vagina. 20cc of 1% lidocaine was injected into the cervix- 5cc at each location of 2, 10, 7 and 5 o'clock.  A single tooth tenaculum was placed on the anterior lip of the cervix. The uterus was then sounded to 8cm. The endocervical canal was then serially dilated to 14French using Hank dilators.  The diagnostic hysteroscope was then inserted and noted to have the findings as listed above. Visualization was achieved using NS as a distending medium. The myosure was inserted and resection of the fibroid was performed. The hysteroscope was removed and sharp curettage was performed. All tissue was sent to pathology.  Hysteroscope was re-inserted, no uterine perforations were seen.  The hysteroscope was removed.  The uterus was sounded to 8cm.  The Mirena IUD was inserted without difficulty.   All instrument were then removed. Hemostasis was observed at the cervical site using silver nitrazine.  The patient was repositioned to the supine position. The patient tolerated the procedure without  any complications and taken to recovery in stable condition.   Janyth Pupa, DO [phone] (pager) [phone] (office)

## 2019-02-12 ENCOUNTER — Encounter: Payer: Self-pay | Admitting: *Deleted

## 2019-02-13 LAB — SURGICAL PATHOLOGY

## 2019-03-27 ENCOUNTER — Ambulatory Visit: Payer: BC Managed Care – PPO | Attending: Internal Medicine

## 2019-03-27 DIAGNOSIS — Z23 Encounter for immunization: Secondary | ICD-10-CM

## 2019-03-27 NOTE — Progress Notes (Signed)
   Covid-19 Vaccination Clinic  Name:  asenath affinito    MRN: [id number] DOB: 1975/03/16  03/27/2019  Ms. Araki was observed post Covid-19 immunization for 15 minutes without incident. She was provided with Vaccine Information Sheet and instruction to access the V-Safe system.   Ms. Stene was instructed to call 911 with any severe reactions post vaccine: [name] Difficulty breathing  . Swelling of face and throat  . A fast heartbeat  . A bad rash all over body  . Dizziness and weakness   Immunizations Administered    Name Date Dose VIS Date Route   Pfizer COVID-19 Vaccine 03/27/2019  4:09 PM 0.3 mL 12/26/2018 Intramuscular   Manufacturer: [place]   Lot: [id number]   [place]: [id number]

## 2019-04-20 ENCOUNTER — Ambulatory Visit: Payer: BC Managed Care – PPO | Attending: Internal Medicine

## 2019-04-20 DIAGNOSIS — Z23 Encounter for immunization: Secondary | ICD-10-CM

## 2019-04-20 NOTE — Progress Notes (Signed)
   Covid-19 Vaccination Clinic  Name:  Cheryl Foley    MRN: Tibbie:[id number] DOB: [birth year]  04/20/2019  [name] was observed post Covid-19 immunization for 15 minutes without incident. She was provided with Vaccine Information Sheet and instruction to access the V-Safe system.   [name] was instructed to call 911 with any severe reactions post vaccine: [name] Difficulty breathing  . Swelling of face and throat  . A fast heartbeat  . A bad rash all over body  . Dizziness and weakness   Immunizations Administered    Name Date Dose VIS Date Route   Pfizer COVID-19 Vaccine 04/20/2019  5:23 PM 0.3 mL 12/26/2018 Intramuscular   Manufacturer: [place]   Lot: [id number]   [place]: [id number]

## 2020-12-09 ENCOUNTER — Ambulatory Visit
Admission: EM | Admit: 2020-12-09 | Discharge: 2020-12-09 | Disposition: A | Discharge status: Home / Self Care | Payer: BC Managed Care – PPO | Attending: Physician Assistant | Admitting: Physician Assistant

## 2020-12-09 ENCOUNTER — Other Ambulatory Visit: Payer: Self-pay

## 2020-12-09 ENCOUNTER — Encounter: Payer: Self-pay | Admitting: Emergency Medicine

## 2020-12-09 DIAGNOSIS — R42 Dizziness and giddiness: Secondary | ICD-10-CM | POA: Diagnosis not present

## 2020-12-09 MED ORDER — ONDANSETRON 4 MG PO TBDP: 4.0000 mg | ORAL_TABLET | Freq: Three times a day (TID) | ORAL | 0 refills | Status: AC | PRN

## 2020-12-09 MED ORDER — MECLIZINE HCL 25 MG PO TABS: 25.0000 mg | ORAL_TABLET | Freq: Three times a day (TID) | ORAL | 0 refills | Status: AC | PRN

## 2020-12-09 NOTE — ED Provider Notes (Signed)
[name]    CSN: [id number] Arrival date & time: 12/09/20  1338      History   Chief Complaint Chief Complaint  Patient presents with   Dizziness   Nausea    HPI Cheryl Foley is a 45 y.o. female.   Patient here today for evaluation of dizziness that seems to get worse when she moves her head.  She states that she started to feel symptoms last night.  She has tried Aleve without significant relief.  She denies any headache.  She has had some nausea due to dizziness. She does report some improvement of symptoms since last night.   The history is provided by the patient.  Dizziness Associated symptoms: nausea   Associated symptoms: no headaches, no shortness of breath and no vomiting    Past Medical History:  Diagnosis Date   Anemia    Hypertension    Multiple thyroid nodules 2014   Checked by u/s  1/15 negative     There are no problems to display for this patient.   Past Surgical History:  Procedure Laterality Date   DILATATION & CURETTAGE/HYSTEROSCOPY WITH MYOSURE N/A 02/11/2019   Procedure: DILATATION & CURETTAGE/HYSTEROSCOPY WITH MYOSURE, MYOMECTOMY;  Surgeon: Janyth Pupa, DO;  Location: [place];  Service: Gynecology;  Laterality: N/A;   LAPAROSCOPIC LYSIS OF ADHESIONS N/A 03/12/2013   Procedure: LAPAROSCOPIC LYSIS OF ADHESIONS;  Surgeon: [name], DO;  Location: [facility];  Service: Gynecology;  Laterality: N/A;   LAPAROSCOPIC OVARIAN CYSTECTOMY Left 03/12/2013   Procedure: LAPAROSCOPIC drainage of OVARIAN CYSTECTOMY;  Surgeon: [name], DO;  Location: [facility];  Service: Gynecology;  Laterality: Left;   NO PAST SURGERIES      OB History   No obstetric history on file.      Home Medications    Prior to Admission medications   Medication Sig Start Date End Date Taking? Authorizing Provider  meclizine (ANTIVERT) 25 MG tablet Take 1 tablet (25 mg total) by mouth 3 (three) times daily as needed for  dizziness. 12/09/20  Yes Francene Finders, PA-C  ondansetron (ZOFRAN-ODT) 4 MG disintegrating tablet Take 1 tablet (4 mg total) by mouth every 8 (eight) hours as needed. 12/09/20  Yes Francene Finders, PA-C  Ferrous Gluconate (IRON 27 PO) Take by mouth.    [provider]  propranolol (INDERAL) 20 MG tablet Take 20 mg by mouth 3 (three) times daily.    [provider]    Family History Family History  Problem Relation Age of Onset   Hypertension Mother     Social History Social History   Tobacco Use   Smoking status: Never   Smokeless tobacco: Never  Substance Use Topics   Alcohol use: No   Drug use: No     Allergies   Patient has no known allergies.   Review of Systems Review of Systems  Constitutional:  Negative for chills and fever.  HENT:  Negative for ear pain.   Eyes:  Negative for discharge and redness.  Respiratory:  Negative for shortness of breath.   Gastrointestinal:  Positive for nausea. Negative for vomiting.  Neurological:  Positive for dizziness. Negative for headaches.    Physical Exam Triage Vital Signs ED Triage Vitals  Enc Vitals Group     BP 12/09/20 1524 (!) 140/95     Pulse Rate 12/09/20 1524 76     Resp 12/09/20 1524 16     Temp 12/09/20 1524 98.4 F (  36.9 C)     Temp Source 12/09/20 1524 Oral     SpO2 12/09/20 1524 98 %     Weight --      Height --      Head Circumference --      Peak Flow --      Pain Score 12/09/20 1525 0     Pain Loc --      Pain Edu? --      Excl. in [place]? --    No data found.  Updated Vital Signs BP (!) 140/95 (BP Location: Left Arm)   Pulse 76   Temp 98.4 F (36.9 C) (Oral)   Resp 16   SpO2 98%   Physical Exam Vitals and nursing note reviewed.  Constitutional:      General: She is not in acute distress.    Appearance: Normal appearance. She is not ill-appearing.  HENT:     Head: Normocephalic and atraumatic.     Right Ear: Tympanic membrane normal.     Left Ear: Tympanic membrane  normal.     Nose: Nose normal. No congestion or rhinorrhea.     Mouth/Throat:     Mouth: Mucous membranes are moist.     Pharynx: No oropharyngeal exudate or posterior oropharyngeal erythema.  Eyes:     Extraocular Movements: Extraocular movements intact.     Conjunctiva/sclera: Conjunctivae normal.     Pupils: Pupils are equal, round, and reactive to light.  Cardiovascular:     Rate and Rhythm: Normal rate and regular rhythm.     Heart sounds: Normal heart sounds. No murmur heard. Pulmonary:     Effort: Pulmonary effort is normal. No respiratory distress.     Breath sounds: Normal breath sounds. No wheezing, rhonchi or rales.  Skin:    General: Skin is warm and dry.  Neurological:     Mental Status: She is alert.  Psychiatric:        Mood and Affect: Mood normal.        Thought Content: Thought content normal.     UC Treatments / Results  Labs (all labs ordered are listed, but only abnormal results are displayed) Labs Reviewed - No data to display  EKG   Radiology No results found.  Procedures Procedures (including critical care time)  Medications Ordered in UC Medications - No data to display  Initial Impression / Assessment and Plan / UC Course  I have reviewed the triage vital signs and the nursing notes.  Pertinent labs & imaging results that were available during my care of the patient were reviewed by me and considered in my medical decision making (see chart for details).    Suspect BPPV given worsened symptoms with movement of head, and will treat with meclizine.  Zofran prescribed for nausea.  Recommended follow-up if symptoms fail to improve or worsen.  Final Clinical Impressions(s) / UC Diagnoses   Final diagnoses:  Vertigo   Discharge Instructions   None    ED Prescriptions     Medication Sig Dispense Auth. Provider   meclizine (ANTIVERT) 25 MG tablet Take 1 tablet (25 mg total) by mouth 3 (three) times daily as needed for dizziness. 30  tablet Ewell Poe F, PA-C   ondansetron (ZOFRAN-ODT) 4 MG disintegrating tablet Take 1 tablet (4 mg total) by mouth every 8 (eight) hours as needed. 20 tablet Francene Finders, PA-C      PDMP not reviewed this encounter.   Francene Finders, PA-C 12/09/20 1650

## 2020-12-09 NOTE — ED Triage Notes (Addendum)
Last night was dizzy when laying down. Woke up dizzy this morning around 4 am. Vomited once this morning, has been nauseous since. C/o upper shoulders/neck soreness.   Did have her hair done recently and the stylist got a lot of water in her ear while washing her hair.

## 2021-12-17 ENCOUNTER — Encounter: Payer: Self-pay | Admitting: Emergency Medicine

## 2021-12-17 ENCOUNTER — Ambulatory Visit (HOSPITAL_BASED_OUTPATIENT_CLINIC_OR_DEPARTMENT_OTHER)
Admission: RE | Admit: 2021-12-17 | Discharge: 2021-12-17 | Disposition: A | Discharge status: Home / Self Care | Payer: BC Managed Care – PPO | Source: Ambulatory Visit | Attending: Emergency Medicine | Admitting: Emergency Medicine

## 2021-12-17 ENCOUNTER — Ambulatory Visit
Admission: EM | Admit: 2021-12-17 | Discharge: 2021-12-17 | Disposition: A | Discharge status: Home / Self Care | Payer: BC Managed Care – PPO | Attending: Emergency Medicine | Admitting: Emergency Medicine

## 2021-12-17 DIAGNOSIS — M12812 Other specific arthropathies, not elsewhere classified, left shoulder: Secondary | ICD-10-CM | POA: Diagnosis not present

## 2021-12-17 DIAGNOSIS — M79602 Pain in left arm: Secondary | ICD-10-CM | POA: Diagnosis not present

## 2021-12-17 DIAGNOSIS — M25512 Pain in left shoulder: Secondary | ICD-10-CM | POA: Insufficient documentation

## 2021-12-17 DIAGNOSIS — M25529 Pain in unspecified elbow: Secondary | ICD-10-CM | POA: Diagnosis not present

## 2021-12-17 MED ORDER — METHYLPREDNISOLONE SODIUM SUCC 125 MG IJ SOLR
80.0000 mg | Freq: Once | INTRAMUSCULAR | Status: AC
  Administered 2021-12-17: 80 mg via INTRAMUSCULAR

## 2021-12-17 MED ORDER — METHYLPREDNISOLONE 4 MG PO TBPK: ORAL_TABLET | ORAL | 0 refills | Status: AC

## 2021-12-17 NOTE — ED Triage Notes (Signed)
Pt reports left arm pain since Wednesday night. States she woke up and noticed the pain in her arm and tried to reposition herself. Pain is located primarily between left shoulder and left elbow and worsens with movement. Denies any obvious injuries.

## 2021-12-17 NOTE — Discharge Instructions (Addendum)
The history that you provided to me today as well as my physical exam findings are concerning for early signs of a syndrome called frozen shoulder, also known as adhesive capsulitis.  This is a chronic and debilitating condition that requires urgent attention.  I have enclosed information about adhesive capsulitis in your packet today that I hope you find helpful.  You were provided with an injection of Solu-Medrol during your visit today that should help reduce some of the inflammation causing your pain at this time.  I have sent a prescription to your pharmacy for a tapering dose of this same steroid that you will take by mouth for the next 6 days.  Please go to the urgent care clinic at [facility] first thing tomorrow morning for further evaluation.  They do not except appointments, they see patients on a walk-in basis only.  Today, if you are able, please go to our [place] location at [facility] to have an x-ray of your shoulder performed.  I have sent the order to that location.  Go to the emergency room entrance and let them know that the x-ray has been ordered.  You do not need to be admitted as a patient to have his x-ray done.  Please do not let them tell you otherwise.  If there is confusion, please call back over here to our clinic and we will speak with them.  Thank you for visiting urgent care today.  I wish you all the best.

## 2021-12-17 NOTE — ED Provider Notes (Signed)
[facility]    CSN: [id number] Arrival date & time: 12/17/21  0805    HISTORY   Chief Complaint  Patient presents with   Arm Pain   HPI Cheryl Foley is a pleasant, 46 y.o. female who presents to urgent care today. Patient reports progressively worsening left arm and shoulder pain that began 5 days ago upon waking up in the middle of the night lying on her left shoulder.  Patient states the pain woke her up and when she tried to reposition herself the pain did not resolve.  Patient states the pain is located somewhere between her left shoulder and her left elbow and worsens with any movement.  Patient denies known injury, recent activities causing abnormal stress on her left shoulder, prior injury to her left shoulder, history of playing sports.  Patient states she is right-handed.  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  Patient states she has not tried anything to alleviate her pain.  The history is provided by the patient.   Past Medical History:  Diagnosis Date   Anemia    Hypertension    Multiple thyroid nodules 2014   Checked by u/s  1/15 negative    There are no problems to display for this patient.  Past Surgical History:  Procedure Laterality Date   DILATATION & CURETTAGE/HYSTEROSCOPY WITH MYOSURE N/A 02/11/2019   Procedure: DILATATION & CURETTAGE/HYSTEROSCOPY WITH MYOSURE, MYOMECTOMY;  Surgeon: Janyth Pupa, DO;  Location: [place];  Service: Gynecology;  Laterality: N/A;   LAPAROSCOPIC LYSIS OF ADHESIONS N/A 03/12/2013   Procedure: LAPAROSCOPIC LYSIS OF ADHESIONS;  Surgeon: [name], DO;  Location: [facility];  Service: Gynecology;  Laterality: N/A;   LAPAROSCOPIC OVARIAN CYSTECTOMY Left 03/12/2013   Procedure: LAPAROSCOPIC drainage of OVARIAN CYSTECTOMY;  Surgeon: [name], DO;  Location: [facility];  Service: Gynecology;  Laterality: Left;   NO PAST SURGERIES     OB History   No obstetric history on  file.    Home Medications    Prior to Admission medications   Medication Sig Start Date End Date Taking? Authorizing Provider  Ferrous Gluconate (IRON 27 PO) Take by mouth.    [provider]  meclizine (ANTIVERT) 25 MG tablet Take 1 tablet (25 mg total) by mouth 3 (three) times daily as needed for dizziness. 12/09/20   Francene Finders, PA-C  ondansetron (ZOFRAN-ODT) 4 MG disintegrating tablet Take 1 tablet (4 mg total) by mouth every 8 (eight) hours as needed. 12/09/20   Francene Finders, PA-C  propranolol (INDERAL) 20 MG tablet Take 20 mg by mouth 3 (three) times daily.    [provider]    Family History Family History  Problem Relation Age of Onset   Hypertension Mother    Social History Social History   Tobacco Use   Smoking status: Never   Smokeless tobacco: Never  Substance Use Topics   Alcohol use: No   Drug use: No   Allergies   Patient has no known allergies.  Review of Systems Review of Systems Pertinent findings revealed after performing a 14 point review of systems has been noted in the history of present illness.  Physical Exam Triage Vital Signs ED Triage Vitals  Enc Vitals Group     BP 11/11/20 0827 (!) 147/82     Pulse Rate 11/11/20 0827 72     Resp 11/11/20 0827 18     Temp 11/11/20 0827 98.3 F (  36.8 C)     Temp Source 11/11/20 0827 Oral     SpO2 11/11/20 0827 98 %     Weight --      Height --      Head Circumference --      Peak Flow --      Pain Score 11/11/20 0826 5     Pain Loc --      Pain Edu? --      Excl. in [place]? --    Updated Vital Signs BP (!) 142/97 (BP Location: Right Arm)   Pulse 82   Temp 98.4 F (36.9 C) (Oral)   Resp 18   SpO2 96%   Physical Exam Vitals and nursing note reviewed.  Constitutional:      General: She is not in acute distress.    Appearance: Normal appearance.  HENT:     Head: Normocephalic and atraumatic.  Eyes:     Pupils: Pupils are equal, round, and reactive to light.   Cardiovascular:     Rate and Rhythm: Normal rate and regular rhythm.  Pulmonary:     Effort: Pulmonary effort is normal.     Breath sounds: Normal breath sounds.  Musculoskeletal:     Left shoulder: Tenderness (Insertion of supraspinatus) present. No swelling, deformity, effusion, laceration, bony tenderness or crepitus. Decreased range of motion (Significantly decreased active range of motion, limited passive range of motion secondary to pain with extension and lateral extension, pain is worse while returning to neutral). Normal pulse.     Cervical back: Normal range of motion and neck supple.  Skin:    General: Skin is warm and dry.  Neurological:     General: No focal deficit present.     Mental Status: She is alert and oriented to person, place, and time. Mental status is at baseline.  Psychiatric:        Mood and Affect: Mood normal.        Behavior: Behavior normal.        Thought Content: Thought content normal.        Judgment: Judgment normal.     UC Couse / Diagnostics / Procedures:     Radiology No results found.  Procedures Procedures (including critical care time) EKG  Pending results:  Labs Reviewed - No data to display  Medications Ordered in UC: Medications  methylPREDNISolone sodium succinate (SOLU-MEDROL) 125 mg/2 mL injection 80 mg (has no administration in time range)    UC Diagnoses / Final Clinical Impressions(s)   I have reviewed the triage vital signs and the nursing notes.  Pertinent labs & imaging results that were available during my care of the patient were reviewed by me and considered in my medical decision making (see chart for details).    Final diagnoses:  Rotator cuff arthropathy of left shoulder   Patient advised that I recommend x-ray of her left shoulder in preparation for follow-up with orthopedics for possible adhesive capsulitis.  Patient advised that we do not have a radiology technician in our clinic today and that she will  need to go an outside facility to have this time.  Patient was provided with a steroid injection during her visit today and advised to begin a tapering dose of methylprednisolone.  Patient advised to follow-up with orthopedics for sick to morning.  Patient provided with a note out of work.  ED Prescriptions     Medication Sig Dispense Auth. Provider   methylPREDNISolone (MEDROL DOSEPAK) 4 MG TBPK tablet Take  24 mg on day 1, 20 mg on day 2, 16 mg on day 3, 12 mg on day 4, 8 mg on day 5, 4 mg on day 6.  Take all tablets in each row at once, do not spread tablets out throughout the day. 21 tablet Lynden Oxford Scales, PA-C      PDMP not reviewed this encounter.  Discharge Instructions:   Discharge Instructions      The history that you provided to me today as well as my physical exam findings are concerning for early signs of a syndrome called frozen shoulder, also known as adhesive capsulitis.  This is a chronic and debilitating condition that requires urgent attention.  I have enclosed information about adhesive capsulitis in your packet today that I hope you find helpful.  You were provided with an injection of Solu-Medrol during your visit today that should help reduce some of the inflammation causing your pain at this time.  I have sent a prescription to your pharmacy for a tapering dose of this same steroid that you will take by mouth for the next 6 days.  Please go to the urgent care clinic at [facility] first thing tomorrow morning for further evaluation.  They do not except appointments, they see patients on a walk-in basis only.  Today, if you are able, please go to our [place] location at [facility] to have an x-ray of your shoulder performed.  I have sent the order to that location.  Go to the emergency room entrance and let them know that the x-ray has been ordered.  You do not need to be admitted as a patient to have his x-ray done.  Please do not let them tell  you otherwise.  If there is confusion, please call back over here to our clinic and we will speak with them.  Thank you for visiting urgent care today.  I wish you all the best.      Disposition Upon Discharge:  Condition: stable for discharge home Home: take medications as prescribed; routine discharge instructions as discussed; follow up as advised.  Patient presented with an acute illness with associated systemic symptoms and significant discomfort requiring urgent management. In my opinion, this is a condition that a prudent lay person (someone who possesses an average knowledge of health and medicine) may potentially expect to result in complications if not addressed urgently such as respiratory distress, impairment of bodily function or dysfunction of bodily organs.   Routine symptom specific, illness specific and/or disease specific instructions were discussed with the patient and/or caregiver at length.   As such, the patient has been evaluated and assessed, work-up was performed and treatment was provided in alignment with urgent care protocols and evidence based medicine.  Patient/parent/caregiver has been advised that the patient may require follow up for further testing and treatment if the symptoms continue in spite of treatment, as clinically indicated and appropriate.  Patient/parent/caregiver has been advised to report to orthopedic urgent care clinic or return to the [facility] or PCP in 3-5 days if no better; follow-up with orthopedics, PCP or the Emergency Department if new signs and symptoms develop or if the current signs or symptoms continue to change or worsen for further workup, evaluation and treatment as clinically indicated and appropriate  The patient will follow up with their current PCP if and as advised. If the patient does not currently have a PCP we will have assisted them in obtaining one.   The patient may need  specialty follow up if the symptoms continue, in spite  of conservative treatment and management, for further workup, evaluation, consultation and treatment as clinically indicated and appropriate.  Patient/parent/caregiver verbalized understanding and agreement of plan as discussed.  All questions were addressed during visit.  Please see discharge instructions below for further details of plan.  This office note has been dictated using Museum/gallery curator.  Unfortunately, this method of dictation can sometimes lead to typographical or grammatical errors.  I apologize for your inconvenience in advance if this occurs.  Please do not hesitate to reach out to me if clarification is needed.      Lynden Oxford Scales, PA-C 12/17/21 1920

## 2023-04-22 ENCOUNTER — Other Ambulatory Visit: Payer: Self-pay | Admitting: Internal Medicine

## 2023-04-22 DIAGNOSIS — E042 Nontoxic multinodular goiter: Secondary | ICD-10-CM

## 2023-05-01 ENCOUNTER — Other Ambulatory Visit (HOSPITAL_COMMUNITY): Payer: Self-pay | Admitting: Internal Medicine

## 2023-05-01 ENCOUNTER — Other Ambulatory Visit: Payer: Self-pay | Admitting: Internal Medicine

## 2023-05-01 DIAGNOSIS — E041 Nontoxic single thyroid nodule: Secondary | ICD-10-CM

## 2023-05-08 ENCOUNTER — Encounter: Payer: Self-pay | Admitting: General Surgery

## 2023-05-09 ENCOUNTER — Ambulatory Visit
Admission: RE | Admit: 2023-05-09 | Discharge: 2023-05-09 | Disposition: A | Discharge status: Home / Self Care | Payer: Self-pay | Source: Ambulatory Visit | Attending: Internal Medicine | Admitting: Internal Medicine

## 2023-05-09 DIAGNOSIS — E041 Nontoxic single thyroid nodule: Secondary | ICD-10-CM

## 2023-05-31 ENCOUNTER — Other Ambulatory Visit: Payer: Self-pay | Admitting: Internal Medicine

## 2023-05-31 DIAGNOSIS — E042 Nontoxic multinodular goiter: Secondary | ICD-10-CM

## 2023-06-16 NOTE — Progress Notes (Shared)
 Chief Complaint: Patient was seen in consultation today for symptomatic thyroid  nodule  Referring Physician(s): Kerr,Jeffrey  History of Present Illness: Cheryl Foley is a 48 y.o. female with a medical history significant for anemia, obesity, HTN and multiple thyroid  nodules. She is familiar to IR from an FNA of a 2 cm left thyroid  nodule in 2014 which was negative for malignancy. She was referred to Endocrinology in 2019 for symptoms of hyperthyroidism and there was concern the left nodule was a hot/toxic nodule. She was referred for a thyroid  uptake scan but this was never performed (patient refused). Her TSH levels have remained mildly low to normal over the years and she has been monitored with routine thyroid  ultrasounds. The left thyroid  nodule has gradually increased in size. Her most recent thyroid  ultrasound shows the left nodule has increased to over 7 cm. She reports mild, intermittent issues with swallowing but mostly she is unhappy with the way her neck looks.    She presents today at the kind referral of Dr. Kathyanne Parkers to discuss a therapeutic aspiration and percutaneous ethanol ablation of this left lobe, mostly cystic nodule.   Thyroid  Symptom Score: 7/10  Thyroid  Cosmetic Score:  Readily detected cosmetic problem (Grade 4).  Past Medical History:  Diagnosis Date   Anemia    Hypertension    Multiple thyroid  nodules 2014   Checked by u/s  1/15 negative     Past Surgical History:  Procedure Laterality Date   DILATATION & CURETTAGE/HYSTEROSCOPY WITH MYOSURE N/A 02/11/2019   Procedure: DILATATION & CURETTAGE/HYSTEROSCOPY WITH MYOSURE, MYOMECTOMY;  Surgeon: Ozan, Jennifer, DO;  Location: [facility];  Service: Gynecology;  Laterality: N/A;   LAPAROSCOPIC LYSIS OF ADHESIONS N/A 03/12/2013   Procedure: LAPAROSCOPIC LYSIS OF ADHESIONS;  Surgeon: Jennifer M Ozan, DO;  Location: [facility];  Service: Gynecology;  Laterality: N/A;   LAPAROSCOPIC OVARIAN  CYSTECTOMY Left 03/12/2013   Procedure: LAPAROSCOPIC drainage of OVARIAN CYSTECTOMY;  Surgeon: Christel Cousins, DO;  Location: [facility];  Service: Gynecology;  Laterality: Left;   NO PAST SURGERIES      Allergies: Patient has no known allergies.  Medications: Prior to Admission medications   Medication Sig Start Date End Date Taking? Authorizing Provider  Ferrous Gluconate (IRON 27 PO) Take by mouth.    [provider]  meclizine  (ANTIVERT ) 25 MG tablet Take 1 tablet (25 mg total) by mouth 3 (three) times daily as needed for dizziness. 12/09/20   Vernestine Gondola, PA-C  methylPREDNISolone  (MEDROL  DOSEPAK) 4 MG TBPK tablet Take 24 mg on day 1, 20 mg on day 2, 16 mg on day 3, 12 mg on day 4, 8 mg on day 5, 4 mg on day 6.  Take all tablets in each row at once, do not spread tablets out throughout the day. 12/17/21   Eloise Hake Scales, PA-C  ondansetron  (ZOFRAN -ODT) 4 MG disintegrating tablet Take 1 tablet (4 mg total) by mouth every 8 (eight) hours as needed. 12/09/20   Vernestine Gondola, PA-C  propranolol (INDERAL) 20 MG tablet Take 20 mg by mouth 3 (three) times daily.    [provider]     Family History  Problem Relation Age of Onset   Hypertension Mother     Social History   Socioeconomic History   Marital status: Single    Spouse name: Not on file   Number of children: Not on file   Years of education: Not on file   Highest education level: Not on  file  Occupational History   Not on file  Tobacco Use   Smoking status: Never   Smokeless tobacco: Never  Substance and Sexual Activity   Alcohol use: No   Drug use: No   Sexual activity: Not Currently    Birth control/protection: None  Other Topics Concern   Not on file  Social History Narrative   Not on file   Social Drivers of Health   Financial Resource Strain: Low Risk  (05/30/2023)   Received from [facility]   Overall Financial Resource Strain (CARDIA)    Difficulty of Paying Living Expenses:  Not hard at all  Food Insecurity: No Food Insecurity (05/30/2023)   Received from [facility]   Hunger Vital Sign    Worried About Running Out of Food in the Last Year: Never true    Ran Out of Food in the Last Year: Never true  Transportation Needs: No Transportation Needs (05/30/2023)   Received from [facility] - Transportation    Lack of Transportation (Medical): No    Lack of Transportation (Non-Medical): No  Physical Activity: Insufficiently Active (02/21/2021)   Received from [facility]   Exercise Vital Sign    Days of Exercise per Week: 1 day    Minutes of Exercise per Session: 20 min  Stress: No Stress Concern Present (02/21/2021)   Received from [facility] - Occupational Stress Questionnaire    Feeling of Stress : Not at all  Social Connections: Unknown (05/29/2021)   Received from [facility]   Social Network    Social Network: Not on file     Review of Systems: A 12 point ROS discussed and pertinent positives are indicated in the HPI above.  All other systems are negative.  Vital Signs: There were no vitals taken for this visit.  Physical Exam Constitutional:      General: She is not in acute distress. HENT:     Head: Normocephalic.     Mouth/Throat:     Mouth: Mucous membranes are moist.  Eyes:     General: No scleral icterus. Neck:      Comments: Prominence of thyroid . Cardiovascular:     Rate and Rhythm: Normal rate and regular rhythm.  Pulmonary:     Effort: Pulmonary effort is normal. No respiratory distress.  Abdominal:     General: There is no distension.  Musculoskeletal:     Right lower leg: No edema.     Left lower leg: No edema.  Skin:    General: Skin is warm and dry.     Coloration: Skin is not jaundiced.  Neurological:     Mental Status: She is alert and oriented to person, place, and time.     Imaging:  US  Thyroid  05/09/23     ~80  mL   Labs:  TFTs - 04/18/23 Serum TSH 0.59  Serum free T4 n/a Serum T3 n/a Thyroperoxidase Antibody n/a Thyroglobulin Antibody n/a Calcitonin n/a   Prior Thyroid  FNA: 02/22/12  - 2 cm solid and partially cystic dominant nodule in the left thyroid . Pathology negative for malignancy.   Assessment and Plan: 48 year old female with a symptomatic, gradually enlarging left thyroid  nodule/cyst measuring approximately 80 mL.  We discussed the rationale, periprocedural expectations, and long term expectations of thyroid  cyst aspiration and ablation.  She would like to proceed.  Plan for left thyroid  cyst aspiration and ethanol ablation at [facility] with  local anesthesia only.       Creasie Doctor, MD Pager: [phone]    I spent a total of  40 Minutes   in face to face in clinical consultation, greater than 50% of which was counseling/coordinating care for symptomatic thyroid  nodule.

## 2023-06-19 ENCOUNTER — Ambulatory Visit
Admission: RE | Admit: 2023-06-19 | Discharge: 2023-06-19 | Disposition: A | Discharge status: Home / Self Care | Payer: Self-pay | Source: Ambulatory Visit | Attending: Internal Medicine | Admitting: Internal Medicine

## 2023-06-19 DIAGNOSIS — E042 Nontoxic multinodular goiter: Secondary | ICD-10-CM

## 2023-06-19 HISTORY — PX: IR RADIOLOGIST EVAL & MGMT: IMG5224

## 2023-06-26 ENCOUNTER — Other Ambulatory Visit (HOSPITAL_COMMUNITY): Payer: Self-pay | Admitting: Interventional Radiology

## 2023-06-26 DIAGNOSIS — E041 Nontoxic single thyroid nodule: Secondary | ICD-10-CM

## 2023-07-16 ENCOUNTER — Ambulatory Visit (HOSPITAL_COMMUNITY)
Admission: RE | Admit: 2023-07-16 | Discharge: 2023-07-16 | Disposition: A | Discharge status: Home / Self Care | Source: Ambulatory Visit | Attending: Interventional Radiology | Admitting: Interventional Radiology

## 2023-07-16 DIAGNOSIS — E041 Nontoxic single thyroid nodule: Secondary | ICD-10-CM

## 2023-07-16 DIAGNOSIS — E0590 Thyrotoxicosis, unspecified without thyrotoxic crisis or storm: Secondary | ICD-10-CM | POA: Insufficient documentation

## 2023-07-16 MED ORDER — ALCOHOL (ABLYSINOL) 99% IA SOLN
INTRA_ARTERIAL | Status: AC
  Filled 2023-07-16: qty 10

## 2023-07-16 MED ORDER — ALCOHOL (ABLYSINOL) 99% IA SOLN
INTRA_ARTERIAL | Status: AC | PRN
  Administered 2023-07-16: 10 mL

## 2023-07-16 MED ORDER — LIDOCAINE-EPINEPHRINE (PF) 2 %-1:200000 IJ SOLN
INTRAMUSCULAR | Status: AC
  Filled 2023-07-16: qty 20

## 2023-07-22 LAB — CYTOLOGY - NON PAP

## 2023-10-04 NOTE — Progress Notes (Signed)
       HPI:  Cheryl Foley is a 48 y.o. (Mar 15, 1975) female presenting today for screening colonoscopy.      Problem List[1]   Past Medical History:  Diagnosis Date  . Costochondritis   . Endometriosis   . Hypertension   . Multiple thyroid  nodules   . Ovarian cyst, left     Past Surgical History:   has a past surgical history that includes OTHER SURGICAL HISTORY (2013) and Ovarian cyst surgery (Left, 02/2013).  Allergies[2]     Medication Sig Dispense Refill  . ALPRAZolam (XANAX) 0.25 mg tablet Take one tablet (0.25 mg dose) by mouth 2 (two) times a day as needed for Anxiety. Max Daily Amount: 0.5 mg 20 tablet 0  . ergocalciferol (VITAMIN D2) 50,000 units CAPS capsule Take one capsule (50,000 Units dose) by mouth once a week at 0900 for 12 doses. 12 capsule 0  . levonorgestrel (MIRENA) IUD one each by Intrauterine route once.    . propranolol HCl (INDERAL LA) 60 mg 24 hr capsule Take one capsule (60 mg dose) by mouth 1 time each day at the same time. 90 capsule 2   No current facility-administered medications for this visit.     Physical Exam:  General:  Alert and oriented, no acute distress Chest:  Clear to auscultation bilaterally CV:  Regular rhythm, normal rate, no murmurs, rubs, or gallops Abd:  Good audible bowel sounds, soft, nontender, no masses, no hepatosplenomegaly  Impression/Plan:  1. Encounter for screening colonoscopy  COLONOSCOPY        I have reviewed and agree with the CRNA's assessment of Mallampati and ASA Score. Patient remains an appropriate candidate for planned anesthesia and colonoscopy.    Potential risks and benefits of colonoscopy were discussed with the patient to include but not limited to bleeding, infection, perforation, rectal irritation, cardiac, pulmonary, and neurologic complications. In a small number of cases, significant lesions could be missed.  Patient expressed understanding and agrees to proceed.  We will proceed with  colonoscopy as planned.        [1] Patient Active Problem List Diagnosis  . Vitamin D deficiency  . Thrombocytosis  . Iron deficiency  . Severe obesity (BMI 35.0-39.9) with comorbidity (*)  . Benign essential HTN  . Low TSH level  . Tachycardia  . Abnormal TSH  . Multiple thyroid  nodules  . Internal hemorrhoids  . Pure hypercholesterolemia  . Morbid obesity with BMI of 40.0-44.9, adult (*)  [2] No Known Allergies

## 2023-10-04 NOTE — Progress Notes (Signed)
   Discharge:  Patient evaluated in recovery and deemed stable following procedure and sedation. Patient being discharged per physician's orders.  All questions and concerns have been addressed.

## 2023-10-20 NOTE — Progress Notes (Signed)
 Your mammogram is normal. Please plan to repeat in 1 year.

## 2023-10-22 ENCOUNTER — Other Ambulatory Visit: Payer: Self-pay | Admitting: Interventional Radiology

## 2023-10-22 DIAGNOSIS — E041 Nontoxic single thyroid nodule: Secondary | ICD-10-CM

## 2023-11-04 NOTE — Progress Notes (Shared)
 Referring Physician(s): Kerr,Jeffrey   Chief Complaint: The patient is seen in follow up today s/p left thyroid  cyst aspiration/sclerotherapy 07/16/23  History of present illness: HPI from initial consultation 06/19/23 Cheryl Foley is a 48 y.o. female with a medical history significant for anemia, obesity, HTN and multiple thyroid  nodules. She is familiar to IR from an FNA of a 2 cm left thyroid  nodule in 2014 which was negative for malignancy. She was referred to Endocrinology in 2019 for symptoms of hyperthyroidism and there was concern the left nodule was a hot/toxic nodule. She was referred for a thyroid  uptake scan but this was never performed (patient refused). Her TSH levels have remained mildly low to normal over the years and she has been monitored with routine thyroid  ultrasounds. The left thyroid  nodule has gradually increased in size. Her most recent thyroid  ultrasound shows the left nodule has increased to over 7 cm. She reports mild, intermittent issues with swallowing but mostly she is unhappy with the way her neck looks.     She presents today at the kind referral of Dr. Faythe to discuss a therapeutic aspiration and percutaneous ethanol ablation of this left lobe, mostly cystic nodule.    Thyroid  Symptom Score: 7/10   Thyroid  Cosmetic Score:  Readily detected cosmetic problem (Grade 4).  We discussed the rationale, periprocedural expectations, and long term expectations of thyroid  cyst aspiration and ablation. She was interested in proceeding and this was performed 07/16/23. Approximately 55 ml of thick, brown fluid was aspirated and this was followed by an installation of 10 ml dehydrated ethanol. This dwelled for 10 min before being aspirated. She tolerated the procedure well and we discussed a repeat thyroid  ultrasound with clinic follow up in two months.   Past Medical History:  Diagnosis Date   Anemia    Hypertension    Multiple thyroid  nodules 2014   Checked by  u/s  1/15 negative     Past Surgical History:  Procedure Laterality Date   DILATATION & CURETTAGE/HYSTEROSCOPY WITH MYOSURE N/A 02/11/2019   Procedure: DILATATION & CURETTAGE/HYSTEROSCOPY WITH MYOSURE, MYOMECTOMY;  Surgeon: Ozan, Jennifer, DO;  Location: [facility];  Service: Gynecology;  Laterality: N/A;   IR RADIOLOGIST EVAL & MGMT  06/19/2023   LAPAROSCOPIC LYSIS OF ADHESIONS N/A 03/12/2013   Procedure: LAPAROSCOPIC LYSIS OF ADHESIONS;  Surgeon: Jennifer M Ozan, DO;  Location: [facility];  Service: Gynecology;  Laterality: N/A;   LAPAROSCOPIC OVARIAN CYSTECTOMY Left 03/12/2013   Procedure: LAPAROSCOPIC drainage of OVARIAN CYSTECTOMY;  Surgeon: Delon CHRISTELLA Prude, DO;  Location: [facility];  Service: Gynecology;  Laterality: Left;   NO PAST SURGERIES      Allergies: Patient has no known allergies.  Medications: Prior to Admission medications   Medication Sig Start Date End Date Taking? Authorizing Provider  Ferrous Gluconate (IRON 27 PO) Take by mouth.    [provider]  meclizine  (ANTIVERT ) 25 MG tablet Take 1 tablet (25 mg total) by mouth 3 (three) times daily as needed for dizziness. 12/09/20   Billy Asberry FALCON, PA-C  methylPREDNISolone  (MEDROL  DOSEPAK) 4 MG TBPK tablet Take 24 mg on day 1, 20 mg on day 2, 16 mg on day 3, 12 mg on day 4, 8 mg on day 5, 4 mg on day 6.  Take all tablets in each row at once, do not spread tablets out throughout the day. 12/17/21   Joesph Shaver Scales, PA-C  ondansetron  (ZOFRAN -ODT) 4 MG disintegrating tablet Take 1 tablet (4 mg total)  by mouth every 8 (eight) hours as needed. 12/09/20   Billy Asberry FALCON, PA-C  propranolol (INDERAL) 20 MG tablet Take 20 mg by mouth 3 (three) times daily.    [provider]     Family History  Problem Relation Age of Onset   Hypertension Mother     Social History   Socioeconomic History   Marital status: Single    Spouse name: Not on file   Number of children: Not on file   Years of  education: Not on file   Highest education level: Not on file  Occupational History   Not on file  Tobacco Use   Smoking status: Never   Smokeless tobacco: Never  Substance and Sexual Activity   Alcohol  use: No   Drug use: No   Sexual activity: Not Currently    Birth control/protection: None  Other Topics Concern   Not on file  Social History Narrative   Not on file   Social Drivers of Health   Financial Resource Strain: Low Risk  (05/30/2023)   Received from [facility]   Overall Financial Resource Strain (CARDIA)    Difficulty of Paying Living Expenses: Not hard at all  Food Insecurity: No Food Insecurity (05/30/2023)   Received from [facility]   Hunger Vital Sign    Within the past 12 months, you worried that your food would run out before you got the money to buy more.: Never true    Within the past 12 months, the food you bought just didn't last and you didn't have money to get more.: Never true  Transportation Needs: No Transportation Needs (05/30/2023)   Received from [facility] - Transportation    Lack of Transportation (Medical): No    Lack of Transportation (Non-Medical): No  Physical Activity: Insufficiently Active (02/21/2021)   Received from [facility]   Exercise Vital Sign    On average, how many days per week do you engage in moderate to strenuous exercise (like a brisk walk)?: 1 day    On average, how many minutes do you engage in exercise at this level?: 20 min  Stress: No Stress Concern Present (02/21/2021)   Received from [facility] - Occupational Stress Questionnaire    Feeling of Stress : Not at all  Social Connections: Unknown (05/29/2021)   Received from [facility]   Social Network    Social Network: Not on file     Vital Signs: There were no vitals taken for this visit.  Physical Exam  Imaging:  US  Thyroid  05/09/23       ~80 mL   Left thyroid  cyst aspiration with  ethanol sclerotherapy 07/16/23    Labs:  CBC: No results for input(s): WBC, HGB, HCT, PLT in the last 8760 hours.  COAGS: No results for input(s): INR, APTT in the last 8760 hours.  BMP: No results for input(s): NA, K, CL, CO2, GLUCOSE, BUN, CALCIUM, CREATININE, GFRNONAA, GFRAA in the last 8760 hours.  Invalid input(s): CMP  LIVER FUNCTION TESTS: No results for input(s): BILITOT, AST, ALT, ALKPHOS, PROT, ALBUMIN in the last 8760 hours.  Assessment and Plan:  48 year old female with a symptomatic, gradually enlarging left thyroid  nodule/cyst measuring approximately 80 mL. She underwent a technically successful aspiration and ethanol sclerotherapy 07/16/23.   Ester Sides, MD Pager: [phone]    I spent a total of 25 Minutes in face to face in clinical consultation, greater than 50% of  which was counseling/coordinating care for left thyroid  cyst.

## 2023-11-06 ENCOUNTER — Ambulatory Visit
Admission: RE | Admit: 2023-11-06 | Discharge: 2023-11-06 | Disposition: A | Source: Ambulatory Visit | Attending: Interventional Radiology

## 2023-11-06 ENCOUNTER — Ambulatory Visit
Admission: RE | Admit: 2023-11-06 | Discharge: 2023-11-06 | Disposition: A | Discharge status: Home / Self Care | Source: Ambulatory Visit | Attending: Interventional Radiology | Admitting: Interventional Radiology

## 2023-11-06 DIAGNOSIS — E041 Nontoxic single thyroid nodule: Secondary | ICD-10-CM

## 2023-11-06 HISTORY — PX: IR RADIOLOGIST EVAL & MGMT: IMG5224
